# Patient Record
Sex: Female | Born: 1965
Health system: Southern US, Community
[De-identification: ages and names within clinical notes are randomized; demographics above are authoritative.]

## PROBLEM LIST (undated history)

## (undated) DIAGNOSIS — R112 Nausea with vomiting, unspecified: Secondary | ICD-10-CM

## (undated) DIAGNOSIS — B977 Papillomavirus as the cause of diseases classified elsewhere: Secondary | ICD-10-CM

## (undated) DIAGNOSIS — D649 Anemia, unspecified: Secondary | ICD-10-CM

## (undated) DIAGNOSIS — E039 Hypothyroidism, unspecified: Secondary | ICD-10-CM

## (undated) DIAGNOSIS — I10 Essential (primary) hypertension: Secondary | ICD-10-CM

## (undated) DIAGNOSIS — Z9889 Other specified postprocedural states: Secondary | ICD-10-CM

## (undated) HISTORY — DX: Essential (primary) hypertension: I10

## (undated) HISTORY — DX: Hypothyroidism, unspecified: E03.9

## (undated) HISTORY — PX: ABDOMINAL HYSTERECTOMY: SHX81

## (undated) HISTORY — PX: WISDOM TOOTH EXTRACTION: SHX21

## (undated) HISTORY — PX: COLONOSCOPY: SHX174

---

## 1988-03-09 HISTORY — PX: ECTOPIC PREGNANCY SURGERY: SHX613

## 2002-09-29 ENCOUNTER — Other Ambulatory Visit: Admission: RE | Admit: 2002-09-29 | Discharge: 2002-09-29 | Payer: Self-pay | Admitting: Gynecology

## 2003-08-08 ENCOUNTER — Other Ambulatory Visit: Admission: RE | Admit: 2003-08-08 | Discharge: 2003-08-08 | Payer: Self-pay | Admitting: Gynecology

## 2003-10-17 ENCOUNTER — Other Ambulatory Visit: Admission: RE | Admit: 2003-10-17 | Discharge: 2003-10-17 | Payer: Self-pay | Admitting: Gynecology

## 2004-03-09 HISTORY — PX: TUBAL LIGATION: SHX77

## 2004-05-29 ENCOUNTER — Other Ambulatory Visit: Admission: RE | Admit: 2004-05-29 | Discharge: 2004-05-29 | Payer: Self-pay | Admitting: Gynecology

## 2004-07-21 ENCOUNTER — Other Ambulatory Visit: Admission: RE | Admit: 2004-07-21 | Discharge: 2004-07-21 | Payer: Self-pay | Admitting: Gynecology

## 2004-08-18 ENCOUNTER — Inpatient Hospital Stay (HOSPITAL_COMMUNITY): Admission: AD | Admit: 2004-08-18 | Discharge: 2004-08-18 | Payer: Self-pay | Admitting: Gynecology

## 2004-12-09 ENCOUNTER — Encounter: Admission: RE | Admit: 2004-12-09 | Discharge: 2004-12-10 | Payer: Self-pay | Admitting: Gynecology

## 2005-01-26 ENCOUNTER — Encounter (INDEPENDENT_AMBULATORY_CARE_PROVIDER_SITE_OTHER): Payer: Self-pay | Admitting: Specialist

## 2005-01-26 ENCOUNTER — Inpatient Hospital Stay (HOSPITAL_COMMUNITY): Admission: RE | Admit: 2005-01-26 | Discharge: 2005-01-29 | Payer: Self-pay | Admitting: Gynecology

## 2005-02-02 ENCOUNTER — Ambulatory Visit: Payer: Self-pay | Admitting: Pulmonary Disease

## 2005-02-02 ENCOUNTER — Inpatient Hospital Stay (HOSPITAL_COMMUNITY): Admission: AD | Admit: 2005-02-02 | Discharge: 2005-02-04 | Payer: Self-pay | Admitting: Gynecology

## 2005-02-06 ENCOUNTER — Emergency Department (HOSPITAL_COMMUNITY): Admission: EM | Admit: 2005-02-06 | Discharge: 2005-02-06 | Payer: Self-pay | Admitting: Emergency Medicine

## 2005-03-18 ENCOUNTER — Other Ambulatory Visit: Admission: RE | Admit: 2005-03-18 | Discharge: 2005-03-18 | Payer: Self-pay | Admitting: Gynecology

## 2006-05-24 ENCOUNTER — Other Ambulatory Visit: Admission: RE | Admit: 2006-05-24 | Discharge: 2006-05-24 | Payer: Self-pay | Admitting: Gynecology

## 2006-09-24 IMAGING — CR DG CERVICAL SPINE COMPLETE 4+V
5 series · 5 of 5 positions shown · non-contrast
Comparison: none

CLINICAL DATA: Posterior neck pain without trauma

Cervical spine 5 view:
Straightening of the normal lordosis of the cervical spine. Negative for
fracture, dislocation, or other acute bone injury. No significant degenerative
change. No prevertebral soft tissue swelling. Multiple dental restorations
noted.

[w c-spine lat]
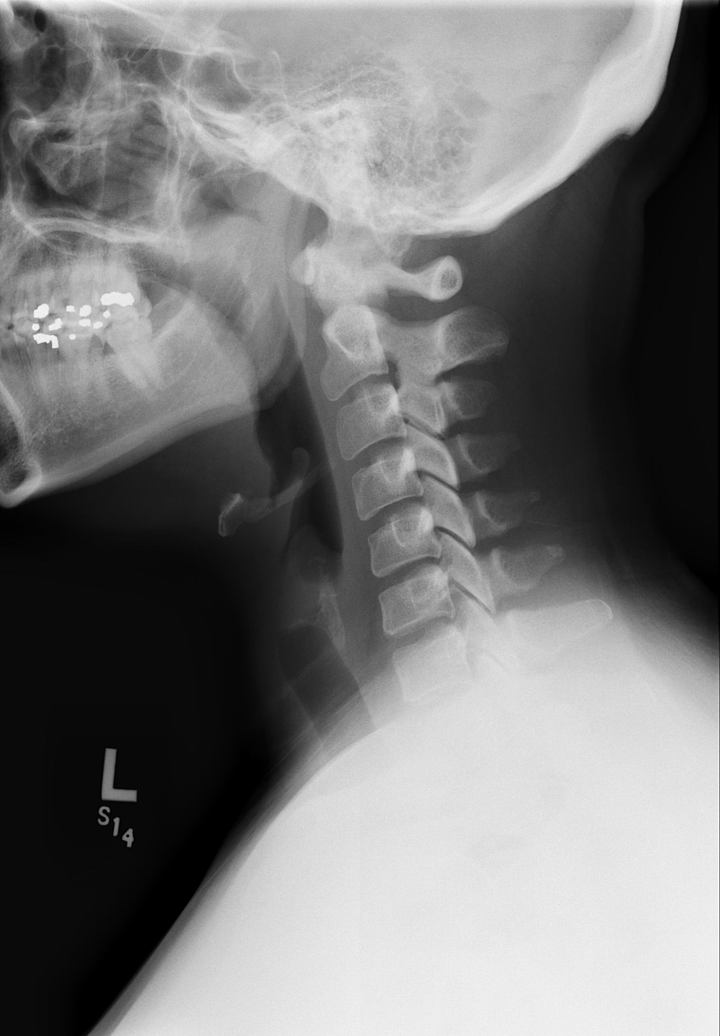

[w c-spine oblique (1 of 2)]
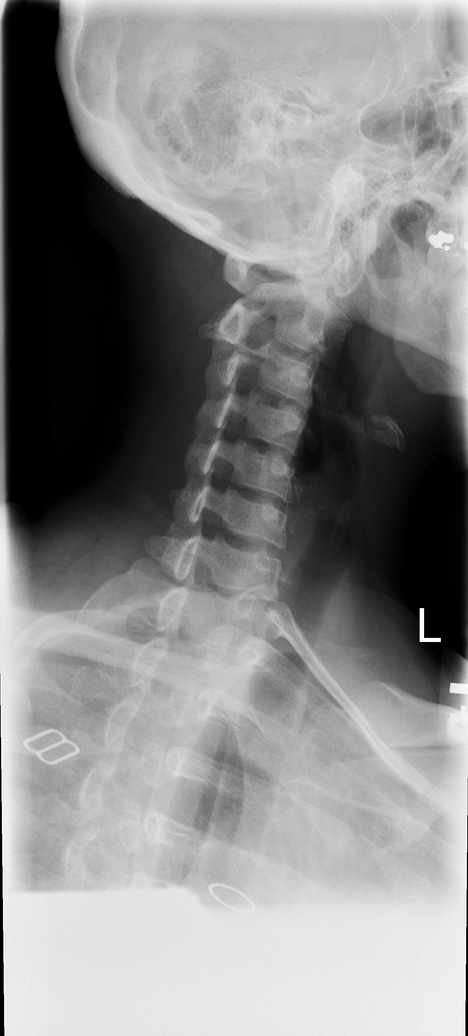

[w c-spine oblique (2 of 2)]
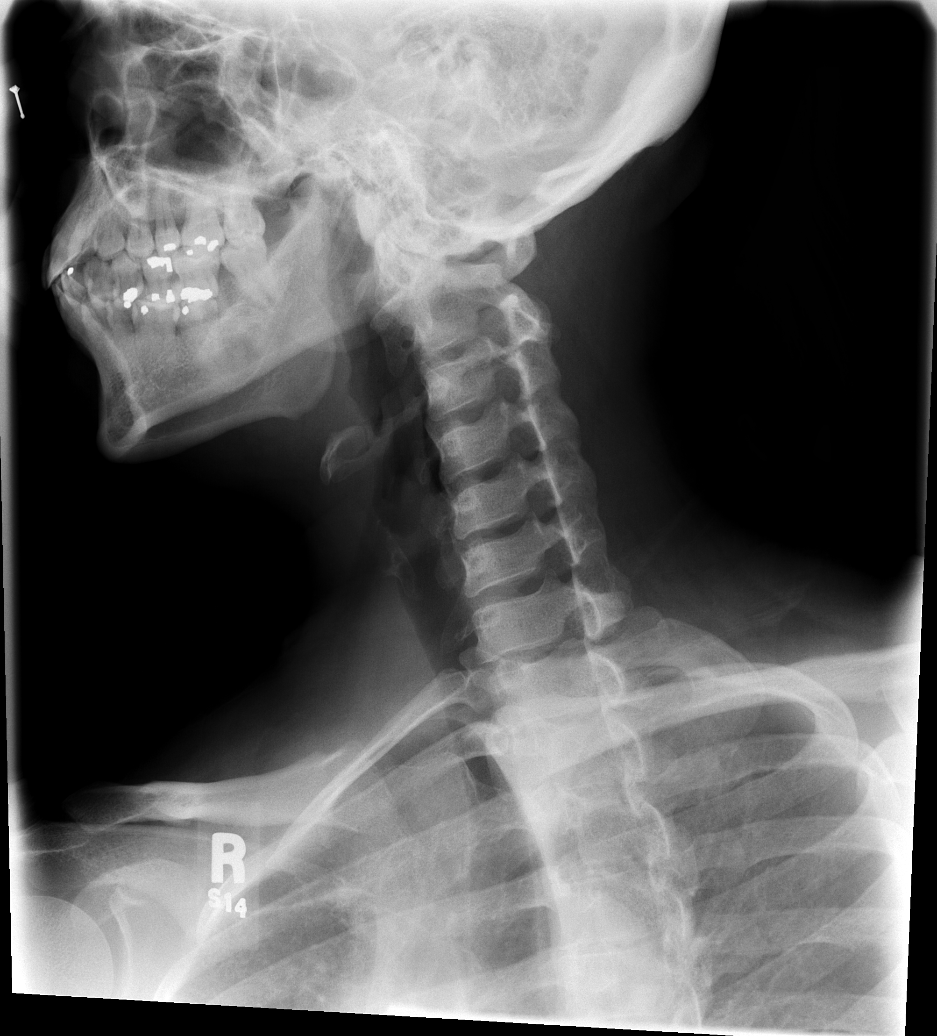

[w c-spine a.p. *]
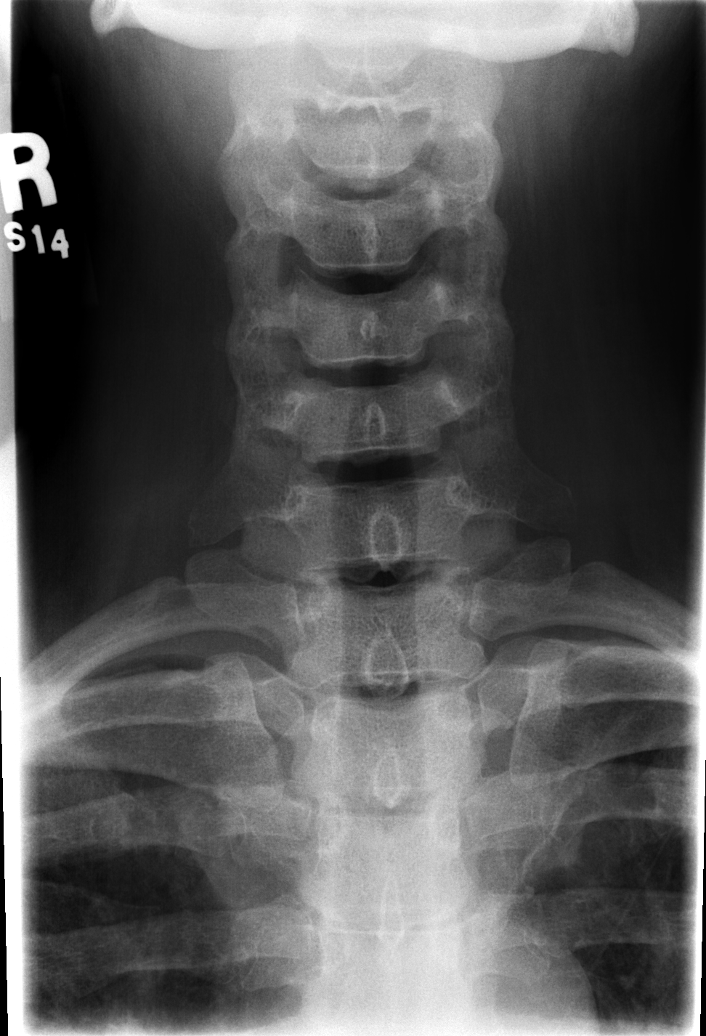

[w c-spine odontoid *]
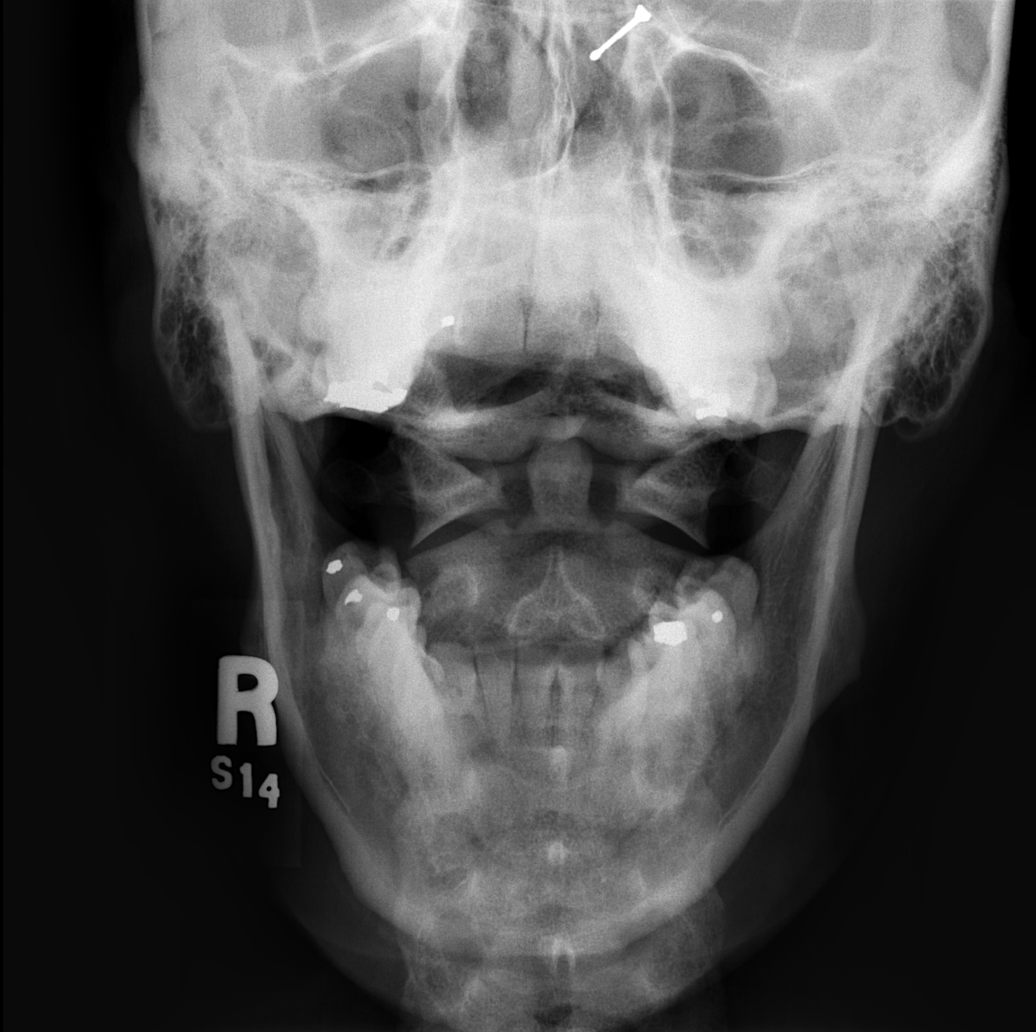

[5 of 5 positions shown; findings below may reference images not displayed]

IMPRESSION: 1. Negative for fracture or other acute bone abnormality.
2. Straightening of the normal lordosis, which may be due to positioning, spasm,
or soft tissue injury.

## 2006-12-09 ENCOUNTER — Encounter: Admission: RE | Admit: 2006-12-09 | Discharge: 2006-12-09 | Payer: Self-pay | Admitting: Family Medicine

## 2007-09-28 ENCOUNTER — Other Ambulatory Visit: Admission: RE | Admit: 2007-09-28 | Discharge: 2007-09-28 | Payer: Self-pay | Admitting: Gynecology

## 2008-04-04 ENCOUNTER — Encounter: Payer: Self-pay | Admitting: Women's Health

## 2008-04-04 ENCOUNTER — Ambulatory Visit: Payer: Self-pay | Admitting: Women's Health

## 2008-04-04 ENCOUNTER — Other Ambulatory Visit: Admission: RE | Admit: 2008-04-04 | Discharge: 2008-04-04 | Payer: Self-pay | Admitting: Gynecology

## 2008-10-01 ENCOUNTER — Encounter: Payer: Self-pay | Admitting: Women's Health

## 2008-10-01 ENCOUNTER — Ambulatory Visit: Payer: Self-pay | Admitting: Women's Health

## 2008-10-01 ENCOUNTER — Other Ambulatory Visit: Admission: RE | Admit: 2008-10-01 | Discharge: 2008-10-01 | Payer: Self-pay | Admitting: Gynecology

## 2008-10-23 ENCOUNTER — Ambulatory Visit: Payer: Self-pay | Admitting: Gynecology

## 2008-11-06 ENCOUNTER — Encounter: Admission: RE | Admit: 2008-11-06 | Discharge: 2008-11-06 | Payer: Self-pay | Admitting: Gynecology

## 2008-11-07 HISTORY — PX: HYSTEROSCOPY: SHX211

## 2008-11-13 ENCOUNTER — Ambulatory Visit: Payer: Self-pay | Admitting: Gynecology

## 2008-11-15 ENCOUNTER — Ambulatory Visit: Payer: Self-pay | Admitting: Gynecology

## 2008-11-15 ENCOUNTER — Encounter: Payer: Self-pay | Admitting: Gynecology

## 2008-11-15 ENCOUNTER — Ambulatory Visit (HOSPITAL_BASED_OUTPATIENT_CLINIC_OR_DEPARTMENT_OTHER): Admission: RE | Admit: 2008-11-15 | Discharge: 2008-11-15 | Payer: Self-pay | Admitting: Gynecology

## 2008-11-29 ENCOUNTER — Ambulatory Visit: Payer: Self-pay | Admitting: Gynecology

## 2009-10-22 ENCOUNTER — Encounter: Admission: RE | Admit: 2009-10-22 | Discharge: 2009-10-22 | Payer: Self-pay | Admitting: Family Medicine

## 2009-11-13 ENCOUNTER — Encounter: Admission: RE | Admit: 2009-11-13 | Discharge: 2009-11-13 | Payer: Self-pay | Admitting: Gynecology

## 2009-11-20 ENCOUNTER — Ambulatory Visit: Payer: Self-pay | Admitting: Gynecology

## 2009-11-20 ENCOUNTER — Other Ambulatory Visit: Admission: RE | Admit: 2009-11-20 | Discharge: 2009-11-20 | Payer: Self-pay | Admitting: Gynecology

## 2010-03-29 ENCOUNTER — Encounter: Payer: Self-pay | Admitting: Gynecology

## 2010-07-25 NOTE — Op Note (Signed)
[name], [name]          ACCOUNT NO.:  [id number]   MEDICAL RECORD NO.:  [id number]          PATIENT TYPE:  INP   LOCATION:  9102                          FACILITY:  [facility]   PHYSICIAN:  Ivor Costa. Farrel Gobble, M.D. DATE OF BIRTH:  December 14, 1965   DATE OF PROCEDURE:  01/26/2005  DATE OF DISCHARGE:                                 OPERATIVE REPORT   PREOPERATIVE DIAGNOSES:  1.  Previous cesarean section for elective repeat.  2.  A2 diabetes.  3.  Chronic hypertension.  4.  Undesired fertility.   POSTOPERATIVE DIAGNOSES:  1.  Previous C-section for elective repeat.  2.  A2 diabetes.  3.  Chronic hypertension.  4.  Undesired fertility.   PROCEDURE:  Repeat cesarean section, low flap transverse, and a right tubal  ligation.   SURGEON:  Ivor Costa. Farrel Gobble, M.D.   ASSISTANT:  Gaetano Hawthorne. Lily Peer, M.D.   ANESTHESIA:  Spinal.   IV FLUIDS:  2300 cc of lactated Ringer's.   ESTIMATED BLOOD LOSS:  700 cc.   URINE OUTPUT:  200 cc of clear urine.   FINDINGS:  Viable female in OA presentation.  Birth weight 6 pounds 11  ounces.  Apgars 8 and 9.  Multifibroid uterus.  Left tube surgically absent.  Right adnexa and left ovary were normal.   PATHOLOGY:  None.   COMPLICATIONS:  None.   DESCRIPTION OF PROCEDURE:  The patient was taken to the operating room.  Spinal anesthesia was induced, and the patient was placed in the supine  position left lateral displacement and prepped and draped in the usual  sterile fashion.   After adequate anesthesia was obtained, a skin incision was made with the  scalpel going through the previous C-section scar and carried through to the  underlying layer of fascia with the scalpel.  The fascia was then nicked in  the midline, and the incision was extended laterally with the Bovie.  The  inferior aspect of the fascial incision was grasped with Kocher's.  The  underlying rectus muscles were dissected off by blunt and sharp dissection  in similar fashion.   The superior aspect of the incision was grasped with  Kocher's, and the underlying rectus muscles were dissected off.  The rectus  muscles were elevated and then entered bluntly into the peritoneal cavity.  The incision was then extended further superiorly with an examining finger  in the pelvis.  The peritoneal incision was then extended superiorly and  inferiorly.  The bladder blade was inserted.  The vesicouterine peritoneum  was identified and entered sharply with Metzenbaum scissors.  The incision  was extended laterally.  The bladder flap was created digitally.  The  bladder blade was then inserted and the lower uterine segment incised in a  transverse fashion with the scalpel.  Clear amniotic fluid was noted.  The  uterine scar was noted to be markedly thin.  The infant was delivered with  the aid of baby [name] [name].  The cord was clamped and cut and handed off to the  awaiting pediatricians.  Cord bloods were obtained.  The uterus was then  massaged the placenta was  allowed to separate naturally.  The uterus was  irrigated of all clots and debris.  The uterine incision was repaired with  running locked layer of 0 chromic.  Because of the thinness of the inferior  portion, it was not imbricated.  A suture was placed at the angle for  hemostasis.  The uterus was then rotated.  The left tube was noted to be  surgically absent.  The ovary was otherwise unremarkable.  The right tube  was visualized and brought into the incision, elevated, and two free ties of  3-0 plain were placed.  The midportion was excised.  It was noted to be  hemostatic and returned back to the pelvis.  The pelvis was then irrigated  with copious amounts of warm saline.  The incision was inspected and noted  to be unremarkable.  The gutters were cleared of all clots and debris.  Inspection of the muscle and peritoneum assured [place] of hemostasis.  The  fascia then closed with 0 Vicryl in a running fashion.  The  subcutaneous  tissue was irrigated, and the skin was closed with staples.  The patient  tolerated the procedure well.  Sponge, lap, and needle counts were correct  x2.   She was transferred to the PACU in stable condition.      Ivor Costa. Farrel Gobble, M.D.  Electronically Signed     THL/MEDQ  D:  01/26/2005  T:  01/26/2005  Job:  [id number]

## 2010-07-25 NOTE — Discharge Summary (Signed)
[name] [name], [name] [name]          ACCOUNT NO.:  [id number]   MEDICAL RECORD NO.:  [id number]          PATIENT TYPE:  INP   LOCATION:  9102                          [facility]ACILITY:  [facility]   PHYSICIAN:  Timothy P. Fontaine, M.D.DATE OF BIRTH:  1965-12-11   DATE OF ADMISSION:  01/26/2005  DATE OF DISCHARGE:                                 DISCHARGE SUMMARY   DISCHARGE DIAGNOSES:  1.  Pregnancy at term.  2.  Prior cesarean section for repeat cesarean section.  3.  Desires permanent sterilization.  4.  A2 diabetes.  5.  Chronic hypertension.   PROCEDURE:  Repeat low transverse cervical cesarean section, right tubal  sterilization 01/2005 Dr. Douglass Rivers.   HOSPITAL COURSE:  A 45 year old gravida 3, para 1 female at term gestation,  history of type A2 diabetes requiring 16 units of NPH at bedtime, history of  chronic hypertension, history of prior cesarean section for repeat cesarean  section, tubal sterilization. The patient has previously undergone a left  salpingectomy due to ectopic pregnancy. The patient underwent an  uncomplicated repeat cesarean section, right tubal sterilization, 01/26/2005  by Dr. Douglass Rivers. Her postoperative course was uncomplicated. She was  discharged on postoperative day #3 ambulating well, tolerating a regular  diet with a postoperative hemoglobin of 9.1. The patient received  precautions, instructions and follow-up. Will be seen in the office in 6  weeks following discharge. She received prescriptions for Motrin 800  milligrams #25 one p.o. q.8 h p.r.n. pain and Tylox #25 one to two p.o. q.4-  6 hours p.r.n. pain. She is blood type A+, rubella titer is positive.      Timothy P. Fontaine, M.D.  Electronically Signed     TPF/MEDQ  D:  01/29/2005  T:  01/29/2005  Job:  [id number]

## 2010-07-25 NOTE — Discharge Summary (Signed)
NAMESAMANTA, [name] [name]          ACCOUNT NO.:  [id number]   MEDICAL RECORD NO.:  [id number]          PATIENT TYPE:  INP   LOCATION:  9306                          [facility]   PHYSICIAN:  Timothy P. Fontaine, M.D.DATE OF BIRTH:  [birth year]   DATE OF ADMISSION:  02/02/2005  DATE OF DISCHARGE:                                 DISCHARGE SUMMARY   DISCHARGE DIAGNOSES:  1.  Immediately postpartum status post cesarean section.  2.  Rule out hemolysis, elevated liver function, and low platelet syndrome.  3.  Rule out cardiac event, ruled out.  4.  Cholelithiasis suspect.   PROCEDURE:  None.   HOSPITAL COURSE:  A 45 year old G3 P2 AB 1 female status post repeat  cesarean section January 26, 2005. The patient was followed for chronic  hypertension, was on Aldomet 250 mg b.i.d.. The patient was admitted after a  several-day history of increased blood pressures at home and chest  discomfort and was found pm admission to have an elevated blood pressure in  the 188/108, 168/106 range. She had elevated SGOT, SGPT, and a decreased  platelet count of 123,000. She was admitted with a diagnosis of HELLP  postpartum, chronic hypertension, chest pain, rule out cardiac event. The  patient subsequently underwent a pulmonary CCU consult, had serial cardiac  isoenzymes, EKG performed, and was felt not to have had a cardiac event as  these were normal. She received magnesium sulfate over 24 hours. Her blood  pressures responded. She was changed to labetalol 100 mg b.i.d. and her  SGOT/SGPT were noted to be decreasing with her last values SGOT is 23 and  SGPT is marginally elevated to 54. Her platelet count was 224,000 at the  time of discharge. During the hospitalization the patient had noted that  during her prenatal course they had seen gallstones on an obstetrical  ultrasound and we reviewed as to whether this was not also responsible for  some of her upper abdominal chest discomfort. The patient  has an ultrasound  of the gallbladder ordered the day of discharge, the results of which are  pending, and tentatively will be scheduled to see a general surgeon assuming  this shows gallstones. The patient was discharged with her blood pressures  in the 140/80 range on labetalol 100 mg b.i.d., will continue on this. She  is able to monitor blood pressures at home and was given strict criteria for  ASAP call precautions, not only for her blood pressure but as far as  symptoms such as headaches, visual changes, increasing swelling, shortness  of breath, or any other symptoms. The patient will follow up with [place] in 4  weeks following discharge for a postpartum check but knows to ASAP if any  symptomatology.      Timothy P. Fontaine, M.D.  Electronically Signed     TPF/MEDQ  D:  02/04/2005  T:  02/04/2005  Job:  [id number]

## 2010-07-25 NOTE — Consult Note (Signed)
[name] [name], [name] [name]          ACCOUNT NO.:  [id number]   MEDICAL RECORD NO.:  [id number]          PATIENT TYPE:  INP   LOCATION:  9306                          [facility]   PHYSICIAN:  Timothy P. Fontaine, M.D.DATE OF BIRTH:  1965-11-16   DATE OF CONSULTATION:  02/04/2005  DATE OF DISCHARGE:                                   CONSULTATION   The patient evaluated on rounds, reports feeling well. She did have an  episode of some upper abdominal gas pain, cramping and some chest discomfort  earlier this morning. This resolved totally and she feels fine now. During  the episode her vitals showed a pulse of 68, blood pressure 163/77, oxygen  saturation at 98%. The patient did note historically during one of her  obstetrical ultrasound they noted gallstones. She has a strong family  history of gallstones and wonders whether this is all related to this.   EXAMINATION AT PRESENT:  HEENT:  Normal.  LUNGS:  Clear.  CARDIAC:  Regular rate. No rubs, murmurs or gallops.  ABDOMEN:  Benign. Her incision is healing nicely.  EXTREMITIES:  DTRs are 2-3+, symmetrical. No clonus, no edema.   I reviewed the situation with the patient. Her blood pressures were  remaining in the 140 range over 80 range, pulses in the 60 range, saturation  is good, and overall she is feeling well. Given the history of gallstones, I  wonder if a lot of this upper abdominal chest discomfort is not related to  her gallstones. Certainly with her elevated blood pressure at admission and  her mild bump and LFTs, consider postpartum HELLP, but also could be  possibly due to gallbladder disease. Regardless, she is feeling well now.  Her cardiac enzymes, other evaluation was all reported negative. We will  check an ultrasound now while she is in the hospital of her gallbladder, and  then tentatively plan on discharging home to continue on her Protonix and  her labetalol 100 mg b.i.d. She is able to take her blood pressure  at home.  She will continue monitor this. As long as it is in an acceptable range,  which we discussed the parameters, she will monitor. She is having no  headaches, visual changes or other symptoms. If she develops any symptoms  whatsoever, she knows to call us ASAP for reevaluation. Discussed follow-up  in a week or so in the office, although she would prefer just to wait as  long as her blood pressure is normal at home and she is feeling well, and  she will see [place] for her postpartum checkup in 4-6 weeks. We will tentatively  plan on general surgical evaluation over the next several weeks with an  appointment, assuming that her ultrasound does confirm gallstones today. The  patient's questions were answered. She is comfortable with the plan and  discharge.      Timothy P. Fontaine, M.D.  Electronically Signed     TPF/MEDQ  D:  02/04/2005  T:  02/04/2005  Job:  [id number]

## 2010-07-25 NOTE — H&P (Signed)
[name] [name], [name] [name]          ACCOUNT NO.:  [id number]   MEDICAL RECORD NO.:  [id number]          PATIENT TYPE:  INP   LOCATION:  NA                            [facility]   PHYSICIAN:  Ivor Costa. Farrel Gobble, M.D. DATE OF BIRTH:  1965-12-24   DATE OF ADMISSION:  01/26/2005  DATE OF DISCHARGE:                                HISTORY & PHYSICAL   CHIEF COMPLAINT:  Term pregnancy for elective repeat.   HISTORY OF PRESENT ILLNESS:  The patient is a 45 year old G3, P1 with an  estimated date of confinement of February 04, 2005 based on an early first  trimester ultrasound who presents for an elective repeat cesarean section.  The patient had a primary cesarean section done in 1995 without any  complaints.  Her pregnancy has been complicated by A2 diabetes for which the  patient is in 16 units of NPH at bedtime.  She reports her fasting finger  sticks still remain above 90.  Her postprandials all have been normal.  The  patient's pregnancy also is complicated by advanced maternal age for which  she underwent an amniocentesis which came back as a normal female and lastly  by multiple uterine fibroids, the largest of which was 8.5 x 6.5 x 6.5 at  which point she ended up having degeneration of the fibroid throughout the  middle portion of the second trimester which has since resolved.  The  patient is currently without complaints.  She is being followed with NSTs  and AFIs because of her gestational diabetes.  She had an estimated fetal  weight done last week which shows a weight of 6.5 pounds with a fluid of  13.3.  The patient reports good vaginal bleeding, no contractions, and is  without complaints.  Please refer to the [facility].  She is A+, antibody  negative.  RPR nonreactive.  Rubella immune.  Hepatitis B surface antigen  nonreactive.  HIV nonreactive.  GBS negative.   PHYSICAL EXAMINATION:  GENERAL:  She is a well-appearing gravida in no acute  distress.  VITAL SIGNS:  Her weight  is 208 which is a 27 pound weight gain.  HEART:  Regular rate.  LUNGS:  Clear to auscultation.  ABDOMEN:  Gravid, soft, nontender with fundal height of 38.  Heart tones  were auscultated.  EXTREMITIES:  Negative.  PELVIC:  Vaginal examination was deferred.   ASSESSMENT:  Term pregnancy with A2 diabetes and previous cesarean section  for an elective repeat.  The patient also requests a tubal ligation.  She  has had a salpingectomy on the left as a result of an ectopic in 1990.  Risks and benefits of the procedure were discussed.  All questions were  addressed.  She was given a prescription for Ultram for postoperative pain.      Ivor Costa. Farrel Gobble, M.D.  Electronically Signed     THL/MEDQ  D:  01/23/2005  T:  01/23/2005  Job:  [id number]

## 2010-11-21 DIAGNOSIS — E039 Hypothyroidism, unspecified: Secondary | ICD-10-CM | POA: Insufficient documentation

## 2010-11-21 DIAGNOSIS — I10 Essential (primary) hypertension: Secondary | ICD-10-CM | POA: Insufficient documentation

## 2010-11-24 ENCOUNTER — Ambulatory Visit (INDEPENDENT_AMBULATORY_CARE_PROVIDER_SITE_OTHER): Payer: BC Managed Care – PPO | Admitting: Women's Health

## 2010-11-24 ENCOUNTER — Other Ambulatory Visit (HOSPITAL_COMMUNITY)
Admission: RE | Admit: 2010-11-24 | Discharge: 2010-11-24 | Disposition: A | Discharge status: Home / Self Care | Payer: BC Managed Care – PPO | Source: Ambulatory Visit | Attending: Gynecology | Admitting: Gynecology

## 2010-11-24 ENCOUNTER — Encounter: Payer: Self-pay | Admitting: Women's Health

## 2010-11-24 VITALS — BP 126/80 | Ht 64.0 in | Wt 194.0 lb

## 2010-11-24 DIAGNOSIS — Z01419 Encounter for gynecological examination (general) (routine) without abnormal findings: Secondary | ICD-10-CM

## 2010-11-24 NOTE — Progress Notes (Signed)
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  44 yo daughter Fonda Kinder in [place].   Past medical history, past surgical history, family history and social history were all reviewed and documented in the EPIC chart.   ROS:  A  ROS was performed and pertinent positives and negatives are included in the history.  Exam:  Filed Vitals:   11/24/10 0843  BP: 126/80    General appearance:  Normal Head/Neck:  Normal, without cervical or supraclavicular adenopathy. Thyroid:  Symmetrical, normal in size, without palpable masses or nodularity. Respiratory  Effort:  Normal  Auscultation:  Clear without wheezing or rhonchi Cardiovascular  Auscultation:  Regular rate, without rubs, murmurs or gallops  Edema/varicosities:  Not grossly evident Abdominal  Soft,nontender, without masses, guarding or rebound.  Liver/spleen:  No organomegaly noted  Hernia:  None appreciated  Skin  Inspection:  Grossly normal  Palpation:  Grossly normal Neurologic/psychiatric  Orientation:  Normal with appropriate conversation.  Mood/affect:  Normal  Genitourinary    Breasts: Examined lying and sitting.     Right: Without masses, retractions, discharge or axillary adenopathy.     Left: Without masses, retractions, discharge or axillary adenopathy.   Inguinal/mons:  Normal without inguinal adenopathy  External genitalia:  Normal  BUS/Urethra/Skene's glands:  Normal  Bladder:  Normal  Vagina:  Normal  Cervix:  Normal  Uterus:  Fibroids.  Midline and mobile  Adnexa/parametria:     Rt: Without masses or tenderness.   Lt: Without masses or tenderness.  Anus and perineum: Normal  Digital rectal exam: Normal sphincter tone without palpated masses or tenderness  Assessment/Plan:  45 y.o.MBF G3P2  for annual exam.  Monthly 6-7day cycle/BTL. History of benign polyp removed  hysteroscopically in Sept, 2010. History of HTN and hypothyroidism, PC- labs and meds. Has had joint problems with elevated ANA and rhuematoid factor-has FU scheduled.    Normal GYN exam with fibroids  Plan: Pap only. SBEs encouraged, annual mammogram, due this month, will schedule.  Encouraged exercise, calcium rich diet.     Harrington Challenger [facility], 1:22 PM 11/24/2010

## 2010-12-16 ENCOUNTER — Other Ambulatory Visit: Payer: Self-pay | Admitting: Gynecology

## 2010-12-16 DIAGNOSIS — Z1231 Encounter for screening mammogram for malignant neoplasm of breast: Secondary | ICD-10-CM

## 2010-12-31 ENCOUNTER — Ambulatory Visit
Admission: RE | Admit: 2010-12-31 | Discharge: 2010-12-31 | Disposition: A | Discharge status: Home / Self Care | Payer: BC Managed Care – PPO | Source: Ambulatory Visit | Attending: Gynecology | Admitting: Gynecology

## 2010-12-31 DIAGNOSIS — Z1231 Encounter for screening mammogram for malignant neoplasm of breast: Secondary | ICD-10-CM

## 2011-12-11 ENCOUNTER — Encounter: Payer: BC Managed Care – PPO | Admitting: Women's Health

## 2011-12-17 ENCOUNTER — Ambulatory Visit (INDEPENDENT_AMBULATORY_CARE_PROVIDER_SITE_OTHER): Payer: BC Managed Care – PPO | Admitting: Women's Health

## 2011-12-17 ENCOUNTER — Encounter: Payer: Self-pay | Admitting: Women's Health

## 2011-12-17 VITALS — BP 128/94 | Ht 65.0 in | Wt 200.0 lb

## 2011-12-17 DIAGNOSIS — Z01419 Encounter for gynecological examination (general) (routine) without abnormal findings: Secondary | ICD-10-CM

## 2011-12-17 NOTE — Progress Notes (Signed)
[name] [name] 10-31-1965 [id number]    History:    The patient presents for annual exam.  Monthly light 2-3 d cycle/BTL. History of normal Paps and mammograms. Hypertensive and hypothyroid/ primary care manages. Hysteroscope 2010 benign polyp.   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 History of GDM. Parents and sister hypertension. Mother diabetes.   ROS:  A  ROS was performed and pertinent positives and negatives are included in the history.  Exam:  Filed Vitals:   12/17/11 0834  BP: 128/94    General appearance:  Normal Head/Neck:  Normal, without cervical or supraclavicular adenopathy. Thyroid:  Symmetrical, normal in size, without palpable masses or nodularity. Respiratory  Effort:  Normal  Auscultation:  Clear without wheezing or rhonchi Cardiovascular  Auscultation:  Regular rate, without rubs, murmurs or gallops  Edema/varicosities:  Not grossly evident Abdominal  Soft,nontender, without masses, guarding or rebound.  Liver/spleen:  No organomegaly noted  Hernia:  None appreciated  Skin  Inspection:  Grossly normal  Palpation:  Grossly normal Neurologic/psychiatric  Orientation:  Normal with appropriate conversation.  Mood/affect:  Normal  Genitourinary    Breasts: Examined lying and sitting.     Right: Without masses, retractions, discharge or axillary adenopathy.     Left: Without masses, retractions, discharge or axillary adenopathy.   Inguinal/mons:  Normal without inguinal adenopathy  External genitalia:  Normal  BUS/Urethra/Skene's glands:  Normal  Bladder:  Normal  Vagina:  Normal  Cervix:  Normal  Uterus:   normal in size, shape and contour.  Midline and mobile  Adnexa/parametria:     Rt: Without masses or  tenderness.   Lt: Without masses or tenderness.  Anus and perineum: Normal  Digital rectal exam: Normal sphincter tone without palpated masses or tenderness  Assessment/Plan:  46 y.o. MBF G3 P2 for annual exam.    Normal GYN exam/BTL Hypertension/hypothyroid-primary care labs and meds Obesity  Plan: SBE's, continue annual mammogram, calcium rich diet, vitamin D 1000 daily, increase exercise and decrease calories for weight loss encouraged. No Pap, normal Pap 2012, new screening guidelines reviewed.   [name] [name] [facility], 10:31 AM 12/17/2011

## 2011-12-17 NOTE — Patient Instructions (Addendum)
Vit D 1000 daily Health Maintenance, Females A healthy lifestyle and preventative care can promote health and wellness.  Maintain regular health, dental, and eye exams.  Eat a healthy diet. Foods like vegetables, fruits, whole grains, low-fat dairy products, and lean protein foods contain the nutrients you need without too many calories. Decrease your intake of foods high in solid fats, added sugars, and salt. Get information about a proper diet from your caregiver, if necessary.  Regular physical exercise is one of the most important things you can do for your health. Most adults should get at least 150 minutes of moderate-intensity exercise (any activity that increases your heart rate and causes you to sweat) each week. In addition, most adults need muscle-strengthening exercises on 2 or more days a week.   Maintain a healthy weight. The body mass index (BMI) is a screening tool to identify possible weight problems. It provides an estimate of body fat based on height and weight. Your caregiver can help determine your BMI, and can help you achieve or maintain a healthy weight. For adults 20 years and older:  A BMI below 18.5 is considered underweight.  A BMI of 18.5 to 24.9 is normal.  A BMI of 25 to 29.9 is considered overweight.  A BMI of 30 and above is considered obese.  Maintain normal blood lipids and cholesterol by exercising and minimizing your intake of saturated fat. Eat a balanced diet with plenty of fruits and vegetables. Blood tests for lipids and cholesterol should begin at age 20 and be repeated every 5 years. If your lipid or cholesterol levels are high, you are over 50, or you are a high risk for heart disease, you may need your cholesterol levels checked more frequently.Ongoing high lipid and cholesterol levels should be treated with medicines if diet and exercise are not effective.  If you smoke, find out from your caregiver how to quit. If you do not use tobacco, do not  start.  If you are pregnant, do not drink alcohol. If you are breastfeeding, be very cautious about drinking alcohol. If you are not pregnant and choose to drink alcohol, do not exceed 1 drink per day. One drink is considered to be 12 ounces (355 mL) of beer, 5 ounces (148 mL) of wine, or 1.5 ounces (44 mL) of liquor.  Avoid use of street drugs. Do not share needles with anyone. Ask for help if you need support or instructions about stopping the use of drugs.  High blood pressure causes heart disease and increases the risk of stroke. Blood pressure should be checked at least every 1 to 2 years. Ongoing high blood pressure should be treated with medicines, if weight loss and exercise are not effective.  If you are 55 to 45 years old, ask your caregiver if you should take aspirin to prevent strokes.  Diabetes screening involves taking a blood sample to check your fasting blood sugar level. This should be done once every 3 years, after age 45, if you are within normal weight and without risk factors for diabetes. Testing should be considered at a younger age or be carried out more frequently if you are overweight and have at least 1 risk factor for diabetes.  Breast cancer screening is essential preventative care for women. You should practice "breast self-awareness." This means understanding the normal appearance and feel of your breasts and may include breast self-examination. Any changes detected, no matter how small, should be reported to a caregiver. Women in their 20s   and 30s should have a clinical breast exam (CBE) by a caregiver as part of a regular health exam every 1 to 3 years. After age 40, women should have a CBE every year. Starting at age 40, women should consider having a mammogram (breast X-ray) every year. Women who have a family history of breast cancer should talk to their caregiver about genetic screening. Women at a high risk of breast cancer should talk to their caregiver about having  an MRI and a mammogram every year.  The Pap test is a screening test for cervical cancer. Women should have a Pap test starting at age 21. Between ages 21 and 29, Pap tests should be repeated every 2 years. Beginning at age 30, you should have a Pap test every 3 years as long as the past 3 Pap tests have been normal. If you had a hysterectomy for a problem that was not cancer or a condition that could lead to cancer, then you no longer need Pap tests. If you are between ages 65 and 70, and you have had normal Pap tests going back 10 years, you no longer need Pap tests. If you have had past treatment for cervical cancer or a condition that could lead to cancer, you need Pap tests and screening for cancer for at least 20 years after your treatment. If Pap tests have been discontinued, risk factors (such as a new sexual partner) need to be reassessed to determine if screening should be resumed. Some women have medical problems that increase the chance of getting cervical cancer. In these cases, your caregiver may recommend more frequent screening and Pap tests.  The human papillomavirus (HPV) test is an additional test that may be used for cervical cancer screening. The HPV test looks for the virus that can cause the cell changes on the cervix. The cells collected during the Pap test can be tested for HPV. The HPV test could be used to screen women aged 30 years and older, and should be used in women of any age who have unclear Pap test results. After the age of 30, women should have HPV testing at the same frequency as a Pap test.  Colorectal cancer can be detected and often prevented. Most routine colorectal cancer screening begins at the age of 50 and continues through age 75. However, your caregiver may recommend screening at an earlier age if you have risk factors for colon cancer. On a yearly basis, your caregiver may provide home test kits to check for hidden blood in the stool. Use of a small camera at  the end of a tube, to directly examine the colon (sigmoidoscopy or colonoscopy), can detect the earliest forms of colorectal cancer. Talk to your caregiver about this at age 50, when routine screening begins. Direct examination of the colon should be repeated every 5 to 10 years through age 75, unless early forms of pre-cancerous polyps or small growths are found.  Hepatitis C blood testing is recommended for all people born from 1945 through 1965 and any individual with known risks for hepatitis C.  Practice safe sex. Use condoms and avoid high-risk sexual practices to reduce the spread of sexually transmitted infections (STIs). Sexually active women aged 25 and younger should be checked for Chlamydia, which is a common sexually transmitted infection. Older women with new or multiple partners should also be tested for Chlamydia. Testing for other STIs is recommended if you are sexually active and at increased risk.  Osteoporosis is a   disease in which the bones lose minerals and strength with aging. This can result in serious bone fractures. The risk of osteoporosis can be identified using a bone density scan. Women ages 65 and over and women at risk for fractures or osteoporosis should discuss screening with their caregivers. Ask your caregiver whether you should be taking a calcium supplement or vitamin D to reduce the rate of osteoporosis.  Menopause can be associated with physical symptoms and risks. Hormone replacement therapy is available to decrease symptoms and risks. You should talk to your caregiver about whether hormone replacement therapy is right for you.  Use sunscreen with a sun protection factor (SPF) of 30 or greater. Apply sunscreen liberally and repeatedly throughout the day. You should seek shade when your shadow is shorter than you. Protect yourself by wearing long sleeves, pants, a wide-brimmed hat, and sunglasses year round, whenever you are outdoors.  Notify your caregiver of new  moles or changes in moles, especially if there is a change in shape or color. Also notify your caregiver if a mole is larger than the size of a pencil eraser.  Stay current with your immunizations. Document Released: 09/08/2010 Document Revised: 05/18/2011 Document Reviewed: 09/08/2010 ExitCare Patient Information 2013 [employer].  

## 2011-12-18 ENCOUNTER — Encounter: Payer: Self-pay | Admitting: Gynecology

## 2012-01-01 ENCOUNTER — Other Ambulatory Visit: Payer: Self-pay | Admitting: Gynecology

## 2012-01-01 DIAGNOSIS — Z1231 Encounter for screening mammogram for malignant neoplasm of breast: Secondary | ICD-10-CM

## 2012-02-05 ENCOUNTER — Ambulatory Visit: Payer: BC Managed Care – PPO

## 2012-04-12 ENCOUNTER — Ambulatory Visit
Admission: RE | Admit: 2012-04-12 | Discharge: 2012-04-12 | Disposition: A | Discharge status: Home / Self Care | Payer: 59 | Source: Ambulatory Visit | Attending: Gynecology | Admitting: Gynecology

## 2012-04-12 DIAGNOSIS — Z1231 Encounter for screening mammogram for malignant neoplasm of breast: Secondary | ICD-10-CM

## 2012-12-28 ENCOUNTER — Encounter: Payer: Self-pay | Admitting: Women's Health

## 2012-12-28 ENCOUNTER — Other Ambulatory Visit (HOSPITAL_COMMUNITY)
Admission: RE | Admit: 2012-12-28 | Discharge: 2012-12-28 | Disposition: A | Discharge status: Home / Self Care | Payer: 59 | Source: Ambulatory Visit | Attending: Gynecology | Admitting: Gynecology

## 2012-12-28 ENCOUNTER — Ambulatory Visit (INDEPENDENT_AMBULATORY_CARE_PROVIDER_SITE_OTHER): Payer: 59 | Admitting: Women's Health

## 2012-12-28 VITALS — BP 140/90 | Ht 64.0 in | Wt 185.0 lb

## 2012-12-28 DIAGNOSIS — N390 Urinary tract infection, site not specified: Secondary | ICD-10-CM

## 2012-12-28 DIAGNOSIS — Z01419 Encounter for gynecological examination (general) (routine) without abnormal findings: Secondary | ICD-10-CM | POA: Insufficient documentation

## 2012-12-28 LAB — URINALYSIS W MICROSCOPIC + REFLEX CULTURE
Bilirubin Urine: NEGATIVE
Casts: NONE SEEN
Crystals: NONE SEEN
Glucose, UA: NEGATIVE mg/dL
Ketones, ur: NEGATIVE mg/dL
Nitrite: NEGATIVE
Specific Gravity, Urine: >1.03 — ABNORMAL HIGH (ref 1.005–1.030)
Urobilinogen, UA: 0.2 mg/dL (ref 0.0–1.0)
pH: 5.5 (ref 5.0–8.0)

## 2012-12-28 MED ORDER — SULFAMETHOXAZOLE-TRIMETHOPRIM 800-160 MG PO TABS: 1.0000 | ORAL_TABLET | Freq: Two times a day (BID) | ORAL | Status: DC

## 2012-12-28 NOTE — Progress Notes (Signed)
[name] [name] Apr 10, 1965 [id number]    History:    The patient presents for annual exam.  Monthly 3-4 days cycle/BTL. Normal Pap and mammogram history. Hypertension and hypothyroidism managed by primary care. Complained of increased urinary frequency with urgency and pain at end of stream for one day, noted blood in urine today. UA: Moderate leukocytes, TNTC WBCs, TNTC RBCs, many bacteria. Reports never having a UTI in the past.   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 History of GDM.Parents hypertension, mother diabetes.   ROS:  A  ROS was performed and pertinent positives and negatives are included in the history.  Exam:  Filed Vitals:   12/28/12 0810  BP: 140/90    General appearance:  Normal Head/Neck:  Normal, without cervical or supraclavicular adenopathy. Thyroid:  Symmetrical, normal in size, without palpable masses or nodularity. Respiratory  Effort:  Normal  Auscultation:  Clear without wheezing or rhonchi Cardiovascular  Auscultation:  Regular rate, without rubs, murmurs or gallops  Edema/varicosities:  Not grossly evident Abdominal  Soft,nontender, without masses, guarding or rebound.  Liver/spleen:  No organomegaly noted  Hernia:  None appreciated  Skin  Inspection:  Grossly normal  Palpation:  Grossly normal Neurologic/psychiatric  Orientation:  Normal with appropriate conversation.  Mood/affect:  Normal  Genitourinary    Breasts: Examined lying and sitting.     Right: Without masses, retractions, discharge or axillary adenopathy.     Left: Without masses, retractions, discharge or axillary adenopathy.   Inguinal/mons:  Normal without inguinal adenopathy  External genitalia:  Normal  BUS/Urethra/Skene's glands:  Normal  Bladder:  Normal  Vagina:  Normal  Cervix:   Normal  Uterus:   normal in size, shape and contour.  Midline and mobile  Adnexa/parametria:     Rt: Without masses or tenderness.   Lt: Without masses or tenderness.  Anus and perineum: Normal  Digital rectal exam: Normal sphincter tone without palpated masses or tenderness  Assessment/Plan:  47 y.o. MBF G3P2 for annual exam.    UTI Normal GYN exam/BTL Hypertension/hypothyroid-primary care manages labs and meds  Plan: Reviewed blood pressure up,  has not taken medication yet. Aware of importance of taking daily. Septra twice a day for 3 days #6 prescription, proper use given and reviewed UTI prevention. Instructed to call if no rate leaf of symptoms. SBE's, continue annual mammogram, calcium rich diet, vitamin D D. 1000 daily encouraged. Continue regular exercise, has lost 15 pounds in the past year with diet and exercise.  UA, urine culture pending, Pap. Pap normal 2012, new screening guidelines reviewed.   Harrington Challenger [place], 9:36 AM 12/28/2012

## 2012-12-28 NOTE — Patient Instructions (Signed)

## 2012-12-30 LAB — URINE CULTURE: Colony Count: 30000

## 2013-04-05 ENCOUNTER — Other Ambulatory Visit: Payer: Self-pay

## 2013-04-05 DIAGNOSIS — Z1231 Encounter for screening mammogram for malignant neoplasm of breast: Secondary | ICD-10-CM

## 2013-04-20 ENCOUNTER — Ambulatory Visit
Admission: RE | Admit: 2013-04-20 | Discharge: 2013-04-20 | Disposition: A | Discharge status: Home / Self Care | Payer: 59 | Source: Ambulatory Visit

## 2013-04-20 DIAGNOSIS — Z1231 Encounter for screening mammogram for malignant neoplasm of breast: Secondary | ICD-10-CM

## 2014-01-08 ENCOUNTER — Encounter: Payer: Self-pay | Admitting: Women's Health

## 2014-01-09 ENCOUNTER — Ambulatory Visit (INDEPENDENT_AMBULATORY_CARE_PROVIDER_SITE_OTHER): Payer: 59 | Admitting: Women's Health

## 2014-01-09 ENCOUNTER — Encounter: Payer: Self-pay | Admitting: Women's Health

## 2014-01-09 ENCOUNTER — Other Ambulatory Visit (HOSPITAL_COMMUNITY)
Admission: RE | Admit: 2014-01-09 | Discharge: 2014-01-09 | Disposition: A | Discharge status: Home / Self Care | Payer: 59 | Source: Ambulatory Visit | Attending: Women's Health | Admitting: Women's Health

## 2014-01-09 VITALS — BP 126/80 | Ht 63.0 in | Wt 196.0 lb

## 2014-01-09 DIAGNOSIS — Z01419 Encounter for gynecological examination (general) (routine) without abnormal findings: Secondary | ICD-10-CM

## 2014-01-09 DIAGNOSIS — Z1151 Encounter for screening for human papillomavirus (HPV): Secondary | ICD-10-CM | POA: Insufficient documentation

## 2014-01-09 DIAGNOSIS — N852 Hypertrophy of uterus: Secondary | ICD-10-CM

## 2014-01-09 NOTE — Progress Notes (Signed)
[name] [name] December 07, 1965 [id number]    History:    Presents for annual exam.  Cycle every 2-3 months with light spotting. BTL. Normal Pap and mammogram history. Hypertension and hypothyroidism managed by primary care. Complained of increased abdominal bloating. Had lost 15lbs last year, but has gained it back this year because of poor diet and lack of exercise.   Past medical history, past surgical history, family history and social history were all reviewed and documented in the EPIC chart.  Daughter in 69rd grade, son Junior in college, doing well. 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 History of GDM. Parents hypertension, mother diabetes  ROS:  A  12 point ROS was performed and pertinent positives and negatives are included.  Exam:  Filed Vitals:   [birth year]09[birth year] 0849  BP: 126/80    General appearance:  Normal Thyroid:  Symmetrical, normal in size, without palpable masses or nodularity. Respiratory  Auscultation:  Clear without wheezing or rhonchi Cardiovascular  Auscultation:  Regular rate, without rubs, murmurs or gallops  Edema/varicosities:  Not grossly evident Abdominal  Soft,nontender, without masses, guarding or rebound.  Liver/spleen:  No organomegaly noted  Hernia:  None appreciated  Skin  Inspection:  Grossly normal   Breasts: Examined lying and sitting.     Right: Without masses, retractions, discharge or axillary adenopathy.     Left: Without masses, retractions, discharge or axillary adenopathy. Gentitourinary   Inguinal/mons:  Normal without inguinal adenopathy  External genitalia:  Normal  BUS/Urethra/Skene's glands:  Normal  Vagina:  Normal  Cervix:  Normal  Uterus:  Enlarged and bulky, 8-10 week,  shape and contour.  Midline and mobile  Adnexa/parametria:     Rt: Without masses or tenderness.   Lt: Without masses or tenderness.  Anus and perineum: Normal  Digital rectal exam: Normal sphincter tone without palpated masses or  tenderness  Assessment/Plan:  48 y.o.  G2P2 MBF for annual exam.    Normal GYN exam/BTL/light cycle every 2-3 months Hypertension/hypothyroid-primary care manages labs and meds Enlarged uterus   Plan:  SBE's, continue annual mammogram, recommended 3D tomography due to dense breasts, calcium rich diet, vitamin D 1000 daily encouraged. Reviewed importance of regular exercise and heart healthy diet,decrease calories for weight loss.Pap with HPV typing. Pap normal 2014, new screening guidelines reviewed, history of HPV years ago. Schedule TV [name] [facility], 9:25 AM 01/09/2014

## 2014-01-09 NOTE — Patient Instructions (Signed)

## 2014-01-10 LAB — URINALYSIS W MICROSCOPIC + REFLEX CULTURE
Bacteria, UA: NONE SEEN
Bilirubin Urine: NEGATIVE
CRYSTALS: NONE SEEN
Casts: NONE SEEN
Glucose, UA: NEGATIVE mg/dL
Hgb urine dipstick: NEGATIVE
Ketones, ur: NEGATIVE mg/dL
Leukocytes, UA: NEGATIVE
NITRITE: NEGATIVE
Protein, ur: NEGATIVE mg/dL
SPECIFIC GRAVITY, URINE: 1.023 (ref 1.005–1.030)
Urobilinogen, UA: 0.2 mg/dL (ref 0.0–1.0)
pH: 5.5 (ref 5.0–8.0)

## 2014-01-11 LAB — CYTOLOGY - PAP

## 2014-01-24 ENCOUNTER — Other Ambulatory Visit: Payer: Self-pay | Admitting: Women's Health

## 2014-01-24 ENCOUNTER — Ambulatory Visit (INDEPENDENT_AMBULATORY_CARE_PROVIDER_SITE_OTHER): Payer: 59

## 2014-01-24 ENCOUNTER — Encounter: Payer: Self-pay | Admitting: Women's Health

## 2014-01-24 ENCOUNTER — Ambulatory Visit (INDEPENDENT_AMBULATORY_CARE_PROVIDER_SITE_OTHER): Payer: 59 | Admitting: Women's Health

## 2014-01-24 VITALS — BP 130/78 | Ht 63.0 in | Wt 196.0 lb

## 2014-01-24 DIAGNOSIS — N8320 Unspecified ovarian cysts: Secondary | ICD-10-CM

## 2014-01-24 DIAGNOSIS — N852 Hypertrophy of uterus: Secondary | ICD-10-CM

## 2014-01-24 DIAGNOSIS — N831 Corpus luteum cyst of ovary, unspecified side: Secondary | ICD-10-CM

## 2014-01-24 DIAGNOSIS — R1900 Intra-abdominal and pelvic swelling, mass and lump, unspecified site: Secondary | ICD-10-CM

## 2014-01-24 DIAGNOSIS — R1031 Right lower quadrant pain: Secondary | ICD-10-CM

## 2014-01-24 DIAGNOSIS — N83202 Unspecified ovarian cyst, left side: Secondary | ICD-10-CM

## 2014-01-24 DIAGNOSIS — K8020 Calculus of gallbladder without cholecystitis without obstruction: Secondary | ICD-10-CM

## 2014-01-24 DIAGNOSIS — D251 Intramural leiomyoma of uterus: Secondary | ICD-10-CM

## 2014-01-24 DIAGNOSIS — N838 Other noninflammatory disorders of ovary, fallopian tube and broad ligament: Secondary | ICD-10-CM

## 2014-01-24 DIAGNOSIS — N839 Noninflammatory disorder of ovary, fallopian tube and broad ligament, unspecified: Secondary | ICD-10-CM

## 2014-01-24 DIAGNOSIS — N83201 Unspecified ovarian cyst, right side: Secondary | ICD-10-CM

## 2014-01-24 NOTE — Patient Instructions (Signed)
Abdominal Hysterectomy Abdominal hysterectomy is a surgical procedure to remove your womb (uterus). Your uterus is the muscular organ that contains a developing baby. This surgery is done for many reasons. You may need an abdominal hysterectomy if you have cancer, growths (tumors), long-term pain, or bleeding. You may also have this procedure if your uterus has slipped down into your vagina (uterine prolapse). Depending on why you need an abdominal hysterectomy, you may also have other reproductive organs removed. These could include the part of your vagina that connects with your uterus (cervix), the organs that make eggs (ovaries), and the tubes that connect the ovaries to the uterus (fallopian tubes). LET YOUR HEALTH CARE PROVIDER KNOW ABOUT:   Any allergies you have.  All medicines you are taking, including vitamins, herbs, eye drops, creams, and over-the-counter medicines.  Previous problems you or members of your family have had with the use of anesthetics.  Any blood disorders you have.  Previous surgeries you have had.  Medical conditions you have. RISKS AND COMPLICATIONS Generally, this is a safe procedure. However, as with any procedure, problems can occur. Infection is the most common problem after an abdominal hysterectomy. Other possible problems include:  Bleeding.  Formation of blood clots that may break free and travel to your lungs.  Injury to other organs near your uterus.  Nerve injury causing nerve pain.  Decreased interest in sex or pain during sexual intercourse. BEFORE THE PROCEDURE  Abdominal hysterectomy is a major surgical procedure. It can affect the way you feel about yourself. Talk to your health care provider about the physical and emotional changes hysterectomy may cause.  You may need to have blood work and X-rays done before surgery.  Quit smoking if you smoke. Ask your health care provider for help if you are struggling to quit.  Stop taking  medicines that thin your blood as directed by your health care provider.  You may be instructed to take antibiotic medicines or laxatives before surgery.  Do not eat or drink anything for 6-8 hours before surgery.  Take your regular medicines with a small sip of water.  Bathe or shower the night or morning before surgery. PROCEDURE  Abdominal hysterectomy is done in the operating room at the hospital.  In most cases, you will be given a medicine that makes you go to sleep (general anesthetic).  The surgeon will make a cut (incision) through the skin in your lower belly.  The incision may be about 5-7 inches long. It may go side-to-side or up-and-down.  The surgeon will move aside the body tissue that covers your uterus. The surgeon will then carefully take out your uterus along with any of your other reproductive organs that need to be removed.  Bleeding will be controlled with clamps or sutures.  The surgeon will close your incision with sutures or metal clips. AFTER THE PROCEDURE  You will have some pain immediately after the procedure.  You will be given pain medicine in the recovery room.  You will be taken to your hospital room when you have recovered from the anesthesia.  You may need to stay in the hospital for 2-5 days.  You will be given instructions for recovery at home. Document Released: 02/28/2013 Document Reviewed: 02/28/2013 ExitCare Patient Information 2015 [employer]. This information is not intended to replace advice given to you by your health care provider. Make sure you discuss any questions you have with your health care provider.  Ovarian Cyst An ovarian cyst is   a fluid-filled sac that forms on an ovary. The ovaries are small organs that produce eggs in women. Various types of cysts can form on the ovaries. Most are not cancerous. Many do not cause problems, and they often go away on their own. Some may cause symptoms and require treatment. Common  types of ovarian cysts include:  Functional cysts--These cysts may occur every month during the menstrual cycle. This is normal. The cysts usually go away with the next menstrual cycle if the woman does not get pregnant. Usually, there are no symptoms with a functional cyst.  Endometrioma cysts--These cysts form from the tissue that lines the uterus. They are also called "chocolate cysts" because they become filled with blood that turns brown. This type of cyst can cause pain in the lower abdomen during intercourse and with your menstrual period.  Cystadenoma cysts--This type develops from the cells on the outside of the ovary. These cysts can get very big and cause lower abdomen pain and pain with intercourse. This type of cyst can twist on itself, cut off its blood supply, and cause severe pain. It can also easily rupture and cause a lot of pain.  Dermoid cysts--This type of cyst is sometimes found in both ovaries. These cysts may contain different kinds of body tissue, such as skin, teeth, hair, or cartilage. They usually do not cause symptoms unless they get very big.  Theca lutein cysts--These cysts occur when too much of a certain hormone (human chorionic gonadotropin) is produced and overstimulates the ovaries to produce an egg. This is most common after procedures used to assist with the conception of a baby (in vitro fertilization). CAUSES   Fertility drugs can cause a condition in which multiple large cysts are formed on the ovaries. This is called ovarian hyperstimulation syndrome.  A condition called polycystic ovary syndrome can cause hormonal imbalances that can lead to nonfunctional ovarian cysts. SIGNS AND SYMPTOMS  Many ovarian cysts do not cause symptoms. If symptoms are present, they may include:  Pelvic pain or pressure.  Pain in the lower abdomen.  Pain during sexual intercourse.  Increasing girth (swelling) of the abdomen.  Abnormal menstrual periods.  Increasing  pain with menstrual periods.  Stopping having menstrual periods without being pregnant. DIAGNOSIS  These cysts are commonly found during a routine or annual pelvic exam. Tests may be ordered to find out more about the cyst. These tests may include:  Ultrasound.  X-ray of the pelvis.  CT scan.  MRI.  Blood tests. TREATMENT  Many ovarian cysts go away on their own without treatment. Your health care provider may want to check your cyst regularly for 2-3 months to see if it changes. For women in menopause, it is particularly important to monitor a cyst closely because of the higher rate of ovarian cancer in menopausal women. When treatment is needed, it may include any of the following:  A procedure to drain the cyst (aspiration). This may be done using a long needle and ultrasound. It can also be done through a laparoscopic procedure. This involves using a thin, lighted tube with a tiny camera on the end (laparoscope) inserted through a small incision.  Surgery to remove the whole cyst. This may be done using laparoscopic surgery or an open surgery involving a larger incision in the lower abdomen.  Hormone treatment or birth control pills. These methods are sometimes used to help dissolve a cyst. HOME CARE INSTRUCTIONS   Only take over-the-counter or prescription medicines as directed   by your health care provider.  Follow up with your health care provider as directed.  Get regular pelvic exams and Pap tests. SEEK MEDICAL CARE IF:   Your periods are late, irregular, or painful, or they stop.  Your pelvic pain or abdominal pain does not go away.  Your abdomen becomes larger or swollen.  You have pressure on your bladder or trouble emptying your bladder completely.  You have pain during sexual intercourse.  You have feelings of fullness, pressure, or discomfort in your stomach.  You lose weight for no apparent reason.  You feel generally ill.  You become constipated.  You  lose your appetite.  You develop acne.  You have an increase in body and facial hair.  You are gaining weight, without changing your exercise and eating habits.  You think you are pregnant. SEEK IMMEDIATE MEDICAL CARE IF:   You have increasing abdominal pain.  You feel sick to your stomach (nauseous), and you throw up (vomit).  You develop a fever that comes on suddenly.  You have abdominal pain during a bowel movement.  Your menstrual periods become heavier than usual. MAKE SURE YOU:  Understand these instructions.  Will watch your condition.  Will get help right away if you are not doing well or get worse. Document Released: 02/23/2005 Document Revised: 02/28/2013 Document Reviewed: 10/31/2012 ExitCare Patient Information 2015 [employer]. This information is not intended to replace advice given to you by your health care provider. Make sure you discuss any questions you have with your health care provider.  

## 2014-01-24 NOTE — Progress Notes (Signed)
Patient ID: [name] [name], female   DOB: 07/23/1965, 48 y.o.   MRN: [id number] Presents for ultrasound, at annual exam noted enlarged uterus, having some irregular cycles. BTL. Has had problems with indigestion, heartburn and intermittent right upper quadrant pain. Hypertension and hypothyroid managed by primary care.  Ultrasound: Transvaginal and transabdominal enlarged uterine fibroids 36 x 26 mm, 15 x 17 mm, 53 x 4246 mm, right cystic irregular solid mass 8.9 x 6.9 x 9 cm positive arterial flow to mass, thick-walled. Left ovary thick-walled cyst 27 x 16 mm echo-free cyst 26 x 26 mm with cyst within cyst 10 mm. Free fluid above the uterus 39 x 12 mm, gallbladder stones noted within gallbladder.  Fibroid uterus Right ovarian solid mass/ probable dermoid Left ovary functional cysts Gallstones  Plan: CA-125, reviewed surgical consult with Dr. Phineas Real for possible hysterectomy and right cystectomy/salpingectomy. History of a left ectopic, fallopian tube removed.

## 2014-01-25 LAB — CA 125: CA 125: 10 U/mL (ref <35)

## 2014-03-23 ENCOUNTER — Ambulatory Visit: Payer: 59 | Admitting: Gynecology

## 2014-03-28 ENCOUNTER — Other Ambulatory Visit: Payer: Self-pay

## 2014-03-28 DIAGNOSIS — Z1231 Encounter for screening mammogram for malignant neoplasm of breast: Secondary | ICD-10-CM

## 2014-04-18 ENCOUNTER — Telehealth: Payer: Self-pay | Admitting: *Deleted

## 2014-04-18 ENCOUNTER — Ambulatory Visit (INDEPENDENT_AMBULATORY_CARE_PROVIDER_SITE_OTHER): Payer: Commercial Managed Care - PPO | Admitting: Gynecology

## 2014-04-18 ENCOUNTER — Encounter: Payer: Self-pay | Admitting: Gynecology

## 2014-04-18 VITALS — BP 130/80

## 2014-04-18 DIAGNOSIS — N8320 Unspecified ovarian cysts: Secondary | ICD-10-CM

## 2014-04-18 DIAGNOSIS — K8020 Calculus of gallbladder without cholecystitis without obstruction: Secondary | ICD-10-CM

## 2014-04-18 DIAGNOSIS — N83201 Unspecified ovarian cyst, right side: Secondary | ICD-10-CM

## 2014-04-18 DIAGNOSIS — R140 Abdominal distension (gaseous): Secondary | ICD-10-CM

## 2014-04-18 DIAGNOSIS — N926 Irregular menstruation, unspecified: Secondary | ICD-10-CM

## 2014-04-18 DIAGNOSIS — D251 Intramural leiomyoma of uterus: Secondary | ICD-10-CM

## 2014-04-18 LAB — CBC WITH DIFFERENTIAL/PLATELET
BASOS ABS: 0 10*3/uL (ref 0.0–0.1)
BASOS PCT: 0 % (ref 0–1)
EOS ABS: 0.1 10*3/uL (ref 0.0–0.7)
EOS PCT: 3 % (ref 0–5)
HCT: 33.5 % — ABNORMAL LOW (ref 36.0–46.0)
Hemoglobin: 11.2 g/dL — ABNORMAL LOW (ref 12.0–15.0)
Lymphocytes Relative: 33 % (ref 12–46)
Lymphs Abs: 1.1 10*3/uL (ref 0.7–4.0)
MCH: 29.7 pg (ref 26.0–34.0)
MCHC: 33.4 g/dL (ref 30.0–36.0)
MCV: 88.9 fL (ref 78.0–100.0)
MONO ABS: 0.3 10*3/uL (ref 0.1–1.0)
MPV: 11.9 fL (ref 8.6–12.4)
Monocytes Relative: 8 % (ref 3–12)
NEUTROS ABS: 1.8 10*3/uL (ref 1.7–7.7)
Neutrophils Relative %: 56 % (ref 43–77)
PLATELETS: 189 10*3/uL (ref 150–400)
RBC: 3.77 MIL/uL — AB (ref 3.87–5.11)
RDW: 14.2 % (ref 11.5–15.5)
WBC: 3.3 10*3/uL — AB (ref 4.0–10.5)

## 2014-04-18 LAB — TSH: TSH: 2.597 u[IU]/mL (ref 0.350–4.500)

## 2014-04-18 LAB — FOLLICLE STIMULATING HORMONE: FSH: 25.3 m[IU]/mL

## 2014-04-18 NOTE — Telephone Encounter (Signed)
-----   Message from [name], MD sent at 04/18/2014  9:51 AM EST ----- Schedule upper abdominal ultrasound at the hospital reference bloating with GYN ultrasound showing gallstones.

## 2014-04-18 NOTE — Telephone Encounter (Signed)
Appointment on 04/19/14 @ 7:30am at [facility] hospital, must be NPO after midnight. Left message for pt to call.

## 2014-04-18 NOTE — Patient Instructions (Signed)
Office will help you schedule gallbladder ultrasound. Assuming it does confirm gallstones I recommend following up with a general surgeon to discuss possible surgery versus observation.  Follow up for GYN ultrasound as scheduled.

## 2014-04-18 NOTE — Progress Notes (Signed)
[name] [name] [birth year] [id number]        48 y.o.  [id number] Presents in follow up having had an ultrasound 01/2014 due to bloating in November which showed an enlarged uterus with multiple myomas the largest measuring 53 mm. Right adnexa with cystic/solid area 9 cm with positive arterial flow questionable dermoid. Left ovary with several small physiologic cystic changes. Also noted was cholelithiasis with gallbladder looked at by ultrasound tech apparently due to her complaining of upper abdominal bloating and discomfort.  CA-125 10. Patient was recommended to schedule an appointment to talk to me about surgical options. She follows up now for this. Is not having significant discomfort or symptoms. Menses are every 30-40 days relatively light without significant cramping. Does note some upper abdominal bloating every once in a while but not consistently. Does not notice significant right upper quadrant pain after eating.  Past medical history,surgical history, problem list, medications, allergies, family history and social history were all reviewed and documented in the EPIC chart.  Directed ROS with pertinent positives and negatives documented in the history of present illness/assessment and plan.  Exam: Kim assistant General appearance:  Normal Abdomen soft nontender without masses guarding rebound or organomegaly. Pelvic external BUS vagina normal. Cervix normal. Uterus irregular approximately ten-week size. Midline mobile nontender. Adnexa without masses or tenderness  Assessment/Plan:  49 y.o. [id number] with history as above. 2 issues:  1. Some bloating symptoms with ultrasound showing cholelithiasis. Reviewed with her as gynecologists prefer patient to have a formal upper abdominal ultrasound by radiology and interpretation. If indeed confirms gallstones then recommended appointment with general surgeon to at least review options to include expectant versus surgery. Patient agrees and  we'll help arrange this. 2. Cystic solid mass right ovary suspicious for dermoid November 2015. Also several myomas. Patient does not appear significantly symptomatic from her myomas with relatively regular although somewhat spaced out menses. Discussed differential as far as the ovarian process to include benign versus malignant. Recommended follow up ultrasound now to relook at this area as it has been 3 months. Various scenarios to include expectant versus interventional surgery reviewed. Possible MRI for better definition if ultrasound is not clear. Patient will follow up with me after she schedules the ultrasound. She did ask me how longi wait before doing this and I again reviewed with her the differential includes ovarian malignancy and that if she does postpone follow up she will except the risks of this.  I also ordered baseline CBC TSH FSH due to her spacing out the upper menses.     [name] MD, 9:41 AM 04/18/2014

## 2014-04-18 NOTE — Telephone Encounter (Signed)
Pt informed with the below note. 

## 2014-04-19 ENCOUNTER — Ambulatory Visit (HOSPITAL_COMMUNITY): Payer: Commercial Managed Care - PPO

## 2014-04-23 ENCOUNTER — Ambulatory Visit: Payer: Self-pay

## 2014-04-24 ENCOUNTER — Ambulatory Visit
Admission: RE | Admit: 2014-04-24 | Discharge: 2014-04-24 | Disposition: A | Discharge status: Home / Self Care | Payer: Commercial Managed Care - PPO | Source: Ambulatory Visit

## 2014-04-24 DIAGNOSIS — Z1231 Encounter for screening mammogram for malignant neoplasm of breast: Secondary | ICD-10-CM

## 2014-04-25 ENCOUNTER — Encounter: Payer: Self-pay | Admitting: Women's Health

## 2014-04-26 ENCOUNTER — Ambulatory Visit (INDEPENDENT_AMBULATORY_CARE_PROVIDER_SITE_OTHER): Payer: Commercial Managed Care - PPO

## 2014-04-26 ENCOUNTER — Other Ambulatory Visit: Payer: Self-pay | Admitting: Gynecology

## 2014-04-26 ENCOUNTER — Ambulatory Visit (INDEPENDENT_AMBULATORY_CARE_PROVIDER_SITE_OTHER): Payer: Commercial Managed Care - PPO | Admitting: Gynecology

## 2014-04-26 ENCOUNTER — Encounter: Payer: Self-pay | Admitting: Gynecology

## 2014-04-26 DIAGNOSIS — N839 Noninflammatory disorder of ovary, fallopian tube and broad ligament, unspecified: Secondary | ICD-10-CM

## 2014-04-26 DIAGNOSIS — N83201 Unspecified ovarian cyst, right side: Secondary | ICD-10-CM

## 2014-04-26 DIAGNOSIS — D251 Intramural leiomyoma of uterus: Secondary | ICD-10-CM

## 2014-04-26 DIAGNOSIS — N852 Hypertrophy of uterus: Secondary | ICD-10-CM

## 2014-04-26 DIAGNOSIS — N8320 Unspecified ovarian cysts: Secondary | ICD-10-CM

## 2014-04-26 DIAGNOSIS — N838 Other noninflammatory disorders of ovary, fallopian tube and broad ligament: Secondary | ICD-10-CM

## 2014-04-26 NOTE — Progress Notes (Signed)
[name] [name] 1965/07/04 [id number]        49 y.o.  [id number] Presents for follow up ultrasound. Had history of bloating leading to ultrasound in November which showed no large uterus with multiple myomas and a cystic solid mass right ovary 48 mm mean suspicious for dermoid. Patient was also found to have incidental gallstones which probably was the etiology of her bloating. Has appointment for upper abdominal ultrasound next week and will probably refer to general surgeon for discussion about cholecystectomy pending these results.  Presents now for follow up ultrasound in reference to the cystic mass on her ovary.  Past medical history,surgical history, problem list, medications, allergies, family history and social history were all reviewed and documented in the EPIC chart.  Directed ROS with pertinent positives and negatives documented in the history of present illness/assessment and plan.  Ultrasound shows uterus enlarged with endometrial echo 7.5 mm. Multiple myomas largest 50 mm. Right ovarian mass cystic solid 48 mm mean with arterial blood flow. Left ovary normal. Cul-de-sac negative  Assessment/Plan:  49 y.o. [id number] with persistent right ovarian cystic solid mass although smaller in size from prior measurement. Suspicious for dermoid. CA-125 was 10. Does not appear to have significant symptoms from her leiomyoma to include heavy periods cramping or pressure. Her menses are relatively light with recent [place] 25. I reviewed various scenarios with her to include TAH/BSO, right salpingo-oophorectomy, observation. Lack of histology to guarantee it is not malignant was discussed. Serial ultrasounds certainly do not show enlargement and if anything a smaller size. At this point the patient prefers observation. She again clearly understands I cannot guarantee it is not a malignancy but she is comfortable following it at least for now. Recommend repeat ultrasound in 3 months with ASAP follow up  precautions if any symptoms develop. Patient agrees with the plan.     [name] MD, 4:46 PM 04/26/2014

## 2014-04-26 NOTE — Patient Instructions (Signed)
Follow up in 3 months for repeat ultrasound as we discussed. Sooner if any issues.

## 2014-05-09 ENCOUNTER — Other Ambulatory Visit: Payer: Commercial Managed Care - PPO

## 2014-05-09 ENCOUNTER — Ambulatory Visit: Payer: Commercial Managed Care - PPO | Admitting: Gynecology

## 2014-05-15 ENCOUNTER — Ambulatory Visit (HOSPITAL_COMMUNITY): Payer: Commercial Managed Care - PPO

## 2014-10-22 ENCOUNTER — Ambulatory Visit (INDEPENDENT_AMBULATORY_CARE_PROVIDER_SITE_OTHER): Payer: BLUE CROSS/BLUE SHIELD

## 2014-10-22 ENCOUNTER — Telehealth: Payer: Self-pay | Admitting: *Deleted

## 2014-10-22 ENCOUNTER — Ambulatory Visit (INDEPENDENT_AMBULATORY_CARE_PROVIDER_SITE_OTHER): Payer: BLUE CROSS/BLUE SHIELD | Admitting: Gynecology

## 2014-10-22 ENCOUNTER — Other Ambulatory Visit: Payer: Self-pay | Admitting: Gynecology

## 2014-10-22 ENCOUNTER — Encounter: Payer: Self-pay | Admitting: Gynecology

## 2014-10-22 VITALS — BP 120/78

## 2014-10-22 DIAGNOSIS — D251 Intramural leiomyoma of uterus: Secondary | ICD-10-CM

## 2014-10-22 DIAGNOSIS — N83201 Unspecified ovarian cyst, right side: Secondary | ICD-10-CM

## 2014-10-22 DIAGNOSIS — N8320 Unspecified ovarian cysts: Secondary | ICD-10-CM

## 2014-10-22 DIAGNOSIS — N838 Other noninflammatory disorders of ovary, fallopian tube and broad ligament: Secondary | ICD-10-CM

## 2014-10-22 DIAGNOSIS — N839 Noninflammatory disorder of ovary, fallopian tube and broad ligament, unspecified: Secondary | ICD-10-CM | POA: Diagnosis not present

## 2014-10-22 DIAGNOSIS — K8020 Calculus of gallbladder without cholecystitis without obstruction: Secondary | ICD-10-CM

## 2014-10-22 NOTE — Telephone Encounter (Signed)
Appointment 10/24/14 at [facility].

## 2014-10-22 NOTE — Progress Notes (Addendum)
[name] [name] [birth year] [id number]        48 y.o.  [id number] presents for follow up ultrasound.  History of some bloating leading to pelvic ultrasound in November 2015 which showed a cystic solid mass in the right ovary 94 mm. Suspicious for dermoid. Also found to have incidental gall stones.  CA 125 10. Also showed multiple myomas 53 mm, 36 mm, 17 mm.  Follow up ultrasound February 2016 right ovary 68 mm. Patient elected expectant management having no symptoms after counseling for surgery. Cannot guarantee not cancer also discussed. Presents now for follow up ultrasound.  Past medical history,surgical history, problem list, medications, allergies, family history and social history were all reviewed and documented in the EPIC chart.  Directed ROS with pertinent positives and negatives documented in the history of present illness/assessment and plan.  Exam: Filed Vitals:   10/22/14 0913  BP: 120/78   General appearance:  Normal  Ultrasound shows uterus enlarged with several myomas measuring 53 mm, 29 mm, 26 mm. Endometrial echo 1.6 mm. Right ovary with cystic/solid mass 62 x 75 x 45 mm. 61 mm mean. Left ovary normal. Cul-de-sac negative.  Assessment/Plan:  49 y.o. [id number] with:  1. Probable right ovarian dermoid stable on serial ultrasound. Patient asymptomatic historically without pelvic pain or dyspareunia. Also with multiple small myomas with relatively light menses and no irregular bleeding. Options for management reviewed to include continued expectant management with repeat ultrasound in 6 months versus surgery to include possible RSO versus RSO/hysterectomy given the myomas. Issues of hysterectomy in a patient with myomas and light menses and no pain also discussed from a prophylactic standpoint. Cannot rule out cancer also discussed. At this point patient prefers expectant management and she is asymptomatic and not interested in surgery at this point. Plan repeat ultrasound in 6  months and she knows to call and follow up for this. 2. Probable gallstones. Patient never followed up for ultrasound as discussed and arranged previously. Patient wants to go ahead and schedule now will make arrangements for this with probable referral to general surgery assuming ultrasound confirms gallstones.    [name] MD, 9:33 AM 10/22/2014

## 2014-10-22 NOTE — Patient Instructions (Signed)
Follow up for gallbladder ultrasound as arranged.  Follow up in 6 months for repeat ultrasound, sooner if you decide to proceed with surgery.

## 2014-10-22 NOTE — Telephone Encounter (Signed)
Appointment at [facility] at 8:30am arrive 15 minutes early per to appointment, npo 6 hours prior. Left message for pt to call.

## 2014-10-22 NOTE — Telephone Encounter (Signed)
Patient informed with the below note.  

## 2014-10-22 NOTE — Telephone Encounter (Signed)
-----   Message from [name], MD sent at 10/22/2014  9:38 AM EDT ----- Schedule ultrasound of the gallbladder reference gallstones.

## 2014-10-24 ENCOUNTER — Ambulatory Visit (HOSPITAL_COMMUNITY): Payer: Commercial Managed Care - PPO

## 2014-10-26 ENCOUNTER — Ambulatory Visit (HOSPITAL_COMMUNITY): Payer: BLUE CROSS/BLUE SHIELD

## 2014-12-11 ENCOUNTER — Telehealth: Payer: Self-pay

## 2014-12-11 NOTE — Telephone Encounter (Signed)
Patient is looking at scheduling an abdominoplasty with Dr. Towanda Malkin.  She was asking if you could do the surgery to remove the cyst at the same time and coordinate the two together.    I mentioned to her that if she is paying out of pocket for abdominoplasty she might want to check costs between [facility] and [facility] center. I only mentioned that to her because I had a patient of Dr. Durenda Guthrie considering the same thing and she decided to do the two surgeries separately because there was a considerate savings to her to do her abdominoplasty at Dr.Truesdale's center. Patient is going to check on this but then had a related question.  She is going to be out for recovery for six weeks with Abdominoplasty. She asked if she decided to do it at [facility] center if you would do her surgery before or after that at [facility] so that she could recover in that same six weeks?  I let her know Dr. Loetta Rough out of office and I will call her next week when he returns and advises me.

## 2014-12-16 NOTE — Telephone Encounter (Signed)
If we do a combined procedure then I would do the hysterectomy first which I believe would probably be a TAH with BSO/USO.  I would have to reexamine her to consider whether we could try to do it as an LAVH. Dr. Towanda Malkin could follow but from recent experience most of the time they do not like doing this but prefer to do it as a separate procedure. If so, I would not do it within 2 months from the abdominoplasty to allow complete healing before going back through the abdomen. I believe Dr. Towanda Malkin would feel the same but she would have to check with him to see how soon after a hysterectomy he would consider doing an abdominoplasty.

## 2014-12-17 NOTE — Telephone Encounter (Signed)
If we are talking laparoscopic oophorectomy or cystectomy then I could do this 1-2 weeks before her abdominoplasty if Dr. Towanda Malkin is okay with this. Otherwise I could do this 5 weeks after her abdominoplasty which would give her 1 week to recover. She does have to understand though that I may not be able to do it laparoscopically and would then have to make a larger incision which would then be a longer recovery. We never know with laparoscopic cases if surprise findings and or complications arise that would require a larger incision.

## 2014-12-17 NOTE — Telephone Encounter (Signed)
Patient did not want hysterectomy but just laparoscopic cystectomy. That was why she was asking if there was any way possible if she elected to do abdominoplasty at [facility] clinic that you could do ov cystectomy at [facility] in that same 6 week period so she could recover while out for abdominoplasty.

## 2014-12-17 NOTE — Telephone Encounter (Signed)
Patient informed. Patient said she has decided to wait. She cannot do this now but when she does she will do each case separately.  She has u/s and visit already scheduled for 04/21/14 with Dr. Loetta Rough.

## 2014-12-17 NOTE — Telephone Encounter (Signed)
Left message to call.

## 2015-01-08 HISTORY — PX: OTHER SURGICAL HISTORY: SHX169

## 2015-01-15 ENCOUNTER — Other Ambulatory Visit: Payer: Self-pay

## 2015-01-28 ENCOUNTER — Other Ambulatory Visit: Payer: Self-pay

## 2015-01-28 DIAGNOSIS — Z1231 Encounter for screening mammogram for malignant neoplasm of breast: Secondary | ICD-10-CM

## 2015-02-13 ENCOUNTER — Encounter: Payer: BLUE CROSS/BLUE SHIELD | Admitting: Women's Health

## 2015-03-06 ENCOUNTER — Encounter: Payer: BLUE CROSS/BLUE SHIELD | Admitting: Women's Health

## 2015-03-11 ENCOUNTER — Other Ambulatory Visit: Payer: Self-pay | Admitting: Gynecology

## 2015-03-11 DIAGNOSIS — R1900 Intra-abdominal and pelvic swelling, mass and lump, unspecified site: Secondary | ICD-10-CM

## 2015-03-27 ENCOUNTER — Ambulatory Visit (INDEPENDENT_AMBULATORY_CARE_PROVIDER_SITE_OTHER): Payer: BLUE CROSS/BLUE SHIELD | Admitting: Women's Health

## 2015-03-27 ENCOUNTER — Other Ambulatory Visit (HOSPITAL_COMMUNITY)
Admission: RE | Admit: 2015-03-27 | Discharge: 2015-03-27 | Disposition: A | Discharge status: Home / Self Care | Payer: BLUE CROSS/BLUE SHIELD | Source: Ambulatory Visit | Attending: Gynecology | Admitting: Gynecology

## 2015-03-27 ENCOUNTER — Encounter: Payer: Self-pay | Admitting: Women's Health

## 2015-03-27 ENCOUNTER — Encounter: Payer: BLUE CROSS/BLUE SHIELD | Admitting: Women's Health

## 2015-03-27 VITALS — BP 126/80 | Wt 196.0 lb

## 2015-03-27 DIAGNOSIS — N912 Amenorrhea, unspecified: Secondary | ICD-10-CM | POA: Diagnosis not present

## 2015-03-27 DIAGNOSIS — Z01419 Encounter for gynecological examination (general) (routine) without abnormal findings: Secondary | ICD-10-CM | POA: Insufficient documentation

## 2015-03-27 NOTE — Patient Instructions (Signed)
Menopause is a normal process in which your reproductive ability comes to an end. This process happens gradually over a span of months to years, usually between the ages of 48 and 55. Menopause is complete when you have missed 12 consecutive menstrual periods. It is important to talk with your health care provider about some of the most common conditions that affect postmenopausal women, such as heart disease, cancer, and bone loss (osteoporosis). Adopting a healthy lifestyle and getting preventive care can help to promote your health and wellness. Those actions can also lower your chances of developing some of these common conditions. WHAT SHOULD I KNOW ABOUT MENOPAUSE? During menopause, you may experience a number of symptoms, such as:  Moderate-to-severe hot flashes.  Night sweats.  Decrease in sex drive.  Mood swings.  Headaches.  Tiredness.  Irritability.  Memory problems.  Insomnia. Choosing to treat or not to treat menopausal changes is an individual decision that you make with your health care provider. WHAT SHOULD I KNOW ABOUT HORMONE REPLACEMENT THERAPY AND SUPPLEMENTS? Hormone therapy products are effective for treating symptoms that are associated with menopause, such as hot flashes and night sweats. Hormone replacement carries certain risks, especially as you become older. If you are thinking about using estrogen or estrogen with progestin treatments, discuss the benefits and risks with your health care provider. WHAT SHOULD I KNOW ABOUT HEART DISEASE AND STROKE? Heart disease, heart attack, and stroke become more likely as you age. This may be due, in part, to the hormonal changes that your body experiences during menopause. These can affect how your body processes dietary fats, triglycerides, and cholesterol. Heart attack and stroke are both medical emergencies. There are many things that you can do to help prevent heart disease and stroke:  Have your blood pressure  checked at least every 1-2 years. High blood pressure causes heart disease and increases the risk of stroke.  If you are 55-79 years old, ask your health care provider if you should take aspirin to prevent a heart attack or a stroke.  Do not use any tobacco products, including cigarettes, chewing tobacco, or electronic cigarettes. If you need help quitting, ask your health care provider.  It is important to eat a healthy diet and maintain a healthy weight.  Be sure to include plenty of vegetables, fruits, low-fat dairy products, and lean protein.  Avoid eating foods that are high in solid fats, added sugars, or salt (sodium).  Get regular exercise. This is one of the most important things that you can do for your health.  Try to exercise for at least 150 minutes each week. The type of exercise that you do should increase your heart rate and make you sweat. This is known as moderate-intensity exercise.  Try to do strengthening exercises at least twice each week. Do these in addition to the moderate-intensity exercise.  Know your numbers.Ask your health care provider to check your cholesterol and your blood glucose. Continue to have your blood tested as directed by your health care provider. WHAT SHOULD I KNOW ABOUT CANCER SCREENING? There are several types of cancer. Take the following steps to reduce your risk and to catch any cancer development as early as possible. Breast Cancer  Practice breast self-awareness.  This means understanding how your breasts normally appear and feel.  It also means doing regular breast self-exams. Let your health care provider know about any changes, no matter how small.  If you are 40 or older, have a clinician do a   breast exam (clinical breast exam or CBE) every year. Depending on your age, family history, and medical history, it may be recommended that you also have a yearly breast X-ray (mammogram).  If you have a family history of breast cancer,  talk with your health care provider about genetic screening.  If you are at high risk for breast cancer, talk with your health care provider about having an MRI and a mammogram every year.  Breast cancer (BRCA) gene test is recommended for women who have family members with BRCA-related cancers. Results of the assessment will determine the need for genetic counseling and BRCA1 and for BRCA2 testing. BRCA-related cancers include these types:  Breast. This occurs in males or females.  Ovarian.  Tubal. This may also be called fallopian tube cancer.  Cancer of the abdominal or pelvic lining (peritoneal cancer).  Prostate.  Pancreatic. Cervical, Uterine, and Ovarian Cancer Your health care provider may recommend that you be screened regularly for cancer of the pelvic organs. These include your ovaries, uterus, and vagina. This screening involves a pelvic exam, which includes checking for microscopic changes to the surface of your cervix (Pap test).  For women ages 21-65, health care providers may recommend a pelvic exam and a Pap test every three years. For women ages 77-65, they may recommend the Pap test and pelvic exam, combined with testing for human papilloma virus (HPV), every five years. Some types of HPV increase your risk of cervical cancer. Testing for HPV may also be done on women of any age who have unclear Pap test results.  Other health care providers may not recommend any screening for nonpregnant women who are considered low risk for pelvic cancer and have no symptoms. Ask your health care provider if a screening pelvic exam is right for you.  If you have had past treatment for cervical cancer or a condition that could lead to cancer, you need Pap tests and screening for cancer for at least 20 years after your treatment. If Pap tests have been discontinued for you, your risk factors (such as having a new sexual partner) need to be reassessed to determine if you should start having  screenings again. Some women have medical problems that increase the chance of getting cervical cancer. In these cases, your health care provider may recommend that you have screening and Pap tests more often.  If you have a family history of uterine cancer or ovarian cancer, talk with your health care provider about genetic screening.  If you have vaginal bleeding after reaching menopause, tell your health care provider.  There are currently no reliable tests available to screen for ovarian cancer. Lung Cancer Lung cancer screening is recommended for adults 3-70 years old who are at high risk for lung cancer because of a history of smoking. A yearly low-dose CT scan of the lungs is recommended if you:  Currently smoke.  Have a history of at least 30 pack-years of smoking and you currently smoke or have quit within the past 15 years. A pack-year is smoking an average of one pack of cigarettes per day for one year. Yearly screening should:  Continue until it has been 15 years since you quit.  Stop if you develop a health problem that would prevent you from having lung cancer treatment. Colorectal Cancer  This type of cancer can be detected and can often be prevented.  Routine colorectal cancer screening usually begins at age 38 and continues through age 12.  If you have  risk factors for colon cancer, your health care provider may recommend that you be screened at an earlier age.  If you have a family history of colorectal cancer, talk with your health care provider about genetic screening.  Your health care provider may also recommend using home test kits to check for hidden blood in your stool.  A small camera at the end of a tube can be used to examine your colon directly (sigmoidoscopy or colonoscopy). This is done to check for the earliest forms of colorectal cancer.  Direct examination of the colon should be repeated every 5-10 years until age 67. However, if early forms of  precancerous polyps or small growths are found or if you have a family history or genetic risk for colorectal cancer, you may need to be screened more often. Skin Cancer  Check your skin from head to toe regularly.  Monitor any moles. Be sure to tell your health care provider:  About any new moles or changes in moles, especially if there is a change in a mole's shape or color.  If you have a mole that is larger than the size of a pencil eraser.  If any of your family members has a history of skin cancer, especially at a Shandreka Dante age, talk with your health care provider about genetic screening.  Always use sunscreen. Apply sunscreen liberally and repeatedly throughout the day.  Whenever you are outside, protect yourself by wearing long sleeves, pants, a wide-brimmed hat, and sunglasses. WHAT SHOULD I KNOW ABOUT OSTEOPOROSIS? Osteoporosis is a condition in which bone destruction happens more quickly than new bone creation. After menopause, you may be at an increased risk for osteoporosis. To help prevent osteoporosis or the bone fractures that can happen because of osteoporosis, the following is recommended:  If you are 39-61 years old, get at least 1,000 mg of calcium and at least 600 mg of vitamin D per day.  If you are older than age 16 but younger than age 7, get at least 1,200 mg of calcium and at least 600 mg of vitamin D per day.  If you are older than age 47, get at least 1,200 mg of calcium and at least 800 mg of vitamin D per day. Smoking and excessive alcohol intake increase the risk of osteoporosis. Eat foods that are rich in calcium and vitamin D, and do weight-bearing exercises several times each week as directed by your health care provider. WHAT SHOULD I KNOW ABOUT HOW MENOPAUSE AFFECTS [place]? Depression may occur at any age, but it is more common as you become older. Common symptoms of depression include:  Low or sad mood.  Changes in sleep patterns.  Changes  in appetite or eating patterns.  Feeling an overall lack of motivation or enjoyment of activities that you previously enjoyed.  Frequent crying spells. Talk with your health care provider if you think that you are experiencing depression. WHAT SHOULD I KNOW ABOUT IMMUNIZATIONS? It is important that you get and maintain your immunizations. These include:  Tetanus, diphtheria, and pertussis (Tdap) booster vaccine.  Influenza every year before the flu season begins.  Pneumonia vaccine.  Shingles vaccine. Your health care provider may also recommend other immunizations.   This information is not intended to replace advice given to you by your health care provider. Make sure you discuss any questions you have with your health care provider.   Document Released: 04/17/2005 Document Revised: 03/16/2014 Document Reviewed: 10/26/2013 Elsevier Interactive Patient Education 2016 [employer] Ovarian Cyst An ovarian cyst is a fluid-filled sac that forms on an ovary. The ovaries are small organs that produce eggs in women. Various types of cysts can form on the ovaries. Most are not cancerous. Many do not cause problems, and they often go away on their own. Some may cause symptoms and require treatment. Common types of ovarian cysts include:  Functional cysts--These cysts may occur every month during the menstrual cycle. This is normal. The cysts usually go away with the next menstrual cycle if the woman does not get pregnant. Usually, there are no symptoms with a functional cyst.  Endometrioma cysts--These cysts form from the tissue that lines the uterus. They are also called "chocolate cysts" because they become filled with blood that turns brown. This type of cyst can cause pain in the lower abdomen during intercourse and with your menstrual period.  Cystadenoma cysts--This type develops from the cells on the outside of the ovary. These cysts can get very big and cause lower abdomen pain and  pain with intercourse. This type of cyst can twist on itself, cut off its blood supply, and cause severe pain. It can also easily rupture and cause a lot of pain.  Dermoid cysts--This type of cyst is sometimes found in both ovaries. These cysts may contain different kinds of body tissue, such as skin, teeth, hair, or cartilage. They usually do not cause symptoms unless they get very big.  Theca lutein cysts--These cysts occur when too much of a certain hormone (human chorionic gonadotropin) is produced and overstimulates the ovaries to produce an egg. This is most common after procedures used to assist with the conception of a baby (in vitro fertilization). CAUSES   Fertility drugs can cause a condition in which multiple large cysts are formed on the ovaries. This is called ovarian hyperstimulation syndrome.  A condition called polycystic ovary syndrome can cause hormonal imbalances that can lead to nonfunctional ovarian cysts. SIGNS AND SYMPTOMS  Many ovarian cysts do not cause symptoms. If symptoms are present, they may include:  Pelvic pain or pressure.  Pain in the lower abdomen.  Pain during sexual intercourse.  Increasing girth (swelling) of the abdomen.  Abnormal menstrual periods.  Increasing pain with menstrual periods.  Stopping having menstrual periods without being pregnant. DIAGNOSIS  These cysts are commonly found during a routine or annual pelvic exam. Tests may be ordered to find out more about the cyst. These tests may include:  Ultrasound.  X-ray of the pelvis.  CT scan.  MRI.  Blood tests. TREATMENT  Many ovarian cysts go away on their own without treatment. Your health care provider may want to check your cyst regularly for 2-3 months to see if it changes. For women in menopause, it is particularly important to monitor a cyst closely because of the higher rate of ovarian cancer in menopausal women. When treatment is needed, it may include any of the  following:  A procedure to drain the cyst (aspiration). This may be done using a long needle and ultrasound. It can also be done through a laparoscopic procedure. This involves using a thin, lighted tube with a tiny camera on the end (laparoscope) inserted through a small incision.  Surgery to remove the whole cyst. This may be done using laparoscopic surgery or an open surgery involving a larger incision in the lower abdomen.  Hormone treatment or birth control pills. These methods are sometimes used to help dissolve a cyst. HOME CARE INSTRUCTIONS   Only  take over-the-counter or prescription medicines as directed by your health care provider.  Follow up with your health care provider as directed.  Get regular pelvic exams and Pap tests. SEEK MEDICAL CARE IF:   Your periods are late, irregular, or painful, or they stop.  Your pelvic pain or abdominal pain does not go away.  Your abdomen becomes larger or swollen.  You have pressure on your bladder or trouble emptying your bladder completely.  You have pain during sexual intercourse.  You have feelings of fullness, pressure, or discomfort in your stomach.  You lose weight for no apparent reason.  You feel generally ill.  You become constipated.  You lose your appetite.  You develop acne.  You have an increase in body and facial hair.  You are gaining weight, without changing your exercise and eating habits.  You think you are pregnant. SEEK IMMEDIATE MEDICAL CARE IF:   You have increasing abdominal pain.  You feel sick to your stomach (nauseous), and you throw up (vomit).  You develop a fever that comes on suddenly.  You have abdominal pain during a bowel movement.  Your menstrual periods become heavier than usual. MAKE SURE YOU:  Understand these instructions.  Will watch your condition.  Will get help right away if you are not doing well or get worse.   This information is not intended to replace advice  given to you by your health care provider. Make sure you discuss any questions you have with your health care provider.   Document Released: 02/23/2005 Document Revised: 02/28/2013 Document Reviewed: 10/31/2012 Elsevier Interactive Patient Education [employer].

## 2015-03-27 NOTE — Progress Notes (Signed)
[name] [name] May 15, 1965 [id number]  History:    Presents for annual exam.  Cycles every 3-4 months/BTL. [name] [name] 26  04/2014. History of a 5 cm fibroid, persistent right ovarian 7 cm cyst has follow-up scheduled with Dr. Phineas Real February 2017. Normal Pap and mammogram history. Hypertension and hypothyroidism managed by primary care. History of GDM. Had a tummy tuck 01/2015.  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  25th anniversary trip planned in [place]. Parents hypertension, mother diabetes.  ROS:  A ROS was performed and pertinent positives and negatives are included.  Exam:  Filed Vitals:   03/27/15 0830  BP: 126/80    General appearance:  Normal Thyroid:  Symmetrical, normal in size, without palpable masses or nodularity. Respiratory  Auscultation:  Clear without wheezing or rhonchi Cardiovascular  Auscultation:  Regular rate, without rubs, murmurs or gallops  Edema/varicosities:  Not grossly evident Abdominal  Soft,nontender, without masses, guarding or rebound.  Liver/spleen:  No organomegaly noted  Hernia:  None appreciated  Skin  Inspection:  Grossly normal   Breasts: Examined lying and sitting.     Right: Without masses, retractions, discharge or axillary adenopathy.     Left: Without masses, retractions, discharge or axillary adenopathy. Gentitourinary   Inguinal/mons:  Normal without inguinal adenopathy  External genitalia:  Normal  BUS/Urethra/Skene's glands:  Normal  Vagina:  Normal  Cervix:  Normal  Uterus: Enlarged, limited exam continues to be tender from tummy tuck  Midline and mobile  Adnexa/parametria:     Rt: Without masses or tenderness.   Lt: Without masses or tenderness.  Anus and perineum: Normal  Digital rectal exam: Normal sphincter tone without palpated masses or  tenderness  Assessment/Plan:  50 y.o.  MBF G3 P2 for annual ewith no complaints.  Perimenopausal cycles every 3-4 months/BTL Hypertension/hyperthyroidism-primary care manages labs and meds Persistent right ovarian 7 cm cyst has follow-up scheduled February 2017  Plan: FSH, UA, Pap. SBE's, continue annual 3-D screening mammogram. Keep scheduled appointment with Dr. Phineas Real. Encouraged regular exercise, calcium rich diet, vitamin D 1000 daily. Reviewed importance of weightbearing exercise.   [name] [facility], 10:53 AM 03/27/2015

## 2015-03-28 LAB — URINALYSIS W MICROSCOPIC + REFLEX CULTURE
BILIRUBIN URINE: NEGATIVE
Bacteria, UA: NONE SEEN [HPF]
CRYSTALS: NONE SEEN [HPF]
Casts: NONE SEEN [LPF]
GLUCOSE, UA: NEGATIVE
HGB URINE DIPSTICK: NEGATIVE
KETONES UR: NEGATIVE
LEUKOCYTES UA: NEGATIVE
Nitrite: NEGATIVE
PROTEIN: NEGATIVE
RBC / HPF: NONE SEEN RBC/HPF (ref <=2)
SQUAMOUS EPITHELIAL / LPF: NONE SEEN [HPF] (ref <=5)
Specific Gravity, Urine: 1.007 (ref 1.001–1.035)
WBC UA: NONE SEEN WBC/HPF (ref <=5)
Yeast: NONE SEEN [HPF]
pH: 5.5 (ref 5.0–8.0)

## 2015-03-28 LAB — FOLLICLE STIMULATING HORMONE: FSH: 46.9 m[IU]/mL

## 2015-04-01 LAB — CYTOLOGY - PAP

## 2015-04-22 ENCOUNTER — Ambulatory Visit: Payer: BLUE CROSS/BLUE SHIELD | Admitting: Gynecology

## 2015-04-22 ENCOUNTER — Other Ambulatory Visit: Payer: BLUE CROSS/BLUE SHIELD

## 2015-04-26 ENCOUNTER — Ambulatory Visit
Admission: RE | Admit: 2015-04-26 | Discharge: 2015-04-26 | Disposition: A | Discharge status: Home / Self Care | Payer: BLUE CROSS/BLUE SHIELD | Source: Ambulatory Visit

## 2015-04-26 DIAGNOSIS — Z1231 Encounter for screening mammogram for malignant neoplasm of breast: Secondary | ICD-10-CM

## 2015-04-30 ENCOUNTER — Other Ambulatory Visit: Payer: Self-pay | Admitting: Family Medicine

## 2015-04-30 DIAGNOSIS — R928 Other abnormal and inconclusive findings on diagnostic imaging of breast: Secondary | ICD-10-CM

## 2015-05-01 ENCOUNTER — Other Ambulatory Visit: Payer: BLUE CROSS/BLUE SHIELD

## 2015-05-01 ENCOUNTER — Ambulatory Visit: Payer: BLUE CROSS/BLUE SHIELD | Admitting: Gynecology

## 2015-05-03 ENCOUNTER — Ambulatory Visit
Admission: RE | Admit: 2015-05-03 | Discharge: 2015-05-03 | Disposition: A | Discharge status: Home / Self Care | Payer: BLUE CROSS/BLUE SHIELD | Source: Ambulatory Visit | Attending: Family Medicine | Admitting: Family Medicine

## 2015-05-03 ENCOUNTER — Other Ambulatory Visit: Payer: BLUE CROSS/BLUE SHIELD

## 2015-05-03 DIAGNOSIS — R928 Other abnormal and inconclusive findings on diagnostic imaging of breast: Secondary | ICD-10-CM

## 2015-10-29 ENCOUNTER — Other Ambulatory Visit: Payer: Self-pay | Admitting: Family Medicine

## 2015-10-29 DIAGNOSIS — N63 Unspecified lump in unspecified breast: Secondary | ICD-10-CM

## 2015-11-20 ENCOUNTER — Ambulatory Visit
Admission: RE | Admit: 2015-11-20 | Discharge: 2015-11-20 | Disposition: A | Discharge status: Home / Self Care | Payer: BLUE CROSS/BLUE SHIELD | Source: Ambulatory Visit | Attending: Family Medicine | Admitting: Family Medicine

## 2015-11-20 DIAGNOSIS — N63 Unspecified lump in unspecified breast: Secondary | ICD-10-CM

## 2016-03-27 ENCOUNTER — Encounter: Payer: Self-pay | Admitting: Women's Health

## 2016-03-27 ENCOUNTER — Ambulatory Visit (INDEPENDENT_AMBULATORY_CARE_PROVIDER_SITE_OTHER): Payer: 59 | Admitting: Women's Health

## 2016-03-27 VITALS — BP 130/86 | Ht 64.0 in | Wt 197.8 lb

## 2016-03-27 DIAGNOSIS — D259 Leiomyoma of uterus, unspecified: Secondary | ICD-10-CM | POA: Insufficient documentation

## 2016-03-27 DIAGNOSIS — Z1151 Encounter for screening for human papillomavirus (HPV): Secondary | ICD-10-CM | POA: Diagnosis not present

## 2016-03-27 DIAGNOSIS — Z01419 Encounter for gynecological examination (general) (routine) without abnormal findings: Secondary | ICD-10-CM

## 2016-03-27 DIAGNOSIS — N83201 Unspecified ovarian cyst, right side: Secondary | ICD-10-CM

## 2016-03-27 DIAGNOSIS — N83209 Unspecified ovarian cyst, unspecified side: Secondary | ICD-10-CM | POA: Insufficient documentation

## 2016-03-27 LAB — URINALYSIS W MICROSCOPIC + REFLEX CULTURE
BILIRUBIN URINE: NEGATIVE
Bacteria, UA: NONE SEEN [HPF]
CASTS: NONE SEEN [LPF]
CRYSTALS: NONE SEEN [HPF]
Glucose, UA: NEGATIVE
Hgb urine dipstick: NEGATIVE
Ketones, ur: NEGATIVE
Leukocytes, UA: NEGATIVE
Nitrite: NEGATIVE
Protein, ur: NEGATIVE
SPECIFIC GRAVITY, URINE: 1.023 (ref 1.001–1.035)
Yeast: NONE SEEN [HPF]
pH: 5.5 (ref 5.0–8.0)

## 2016-03-27 NOTE — Patient Instructions (Signed)
Colonoscopy  Dr Carlean Purl at [facility] Postmenopausal Women Introduction Menopause is a normal process in which your reproductive ability comes to an end. This process happens gradually over a span of months to years, usually between the ages of 29 and 79. Menopause is complete when you have missed 12 consecutive menstrual periods. It is important to talk with your health care provider about some of the most common conditions that affect postmenopausal women, such as heart disease, cancer, and bone loss (osteoporosis). Adopting a healthy lifestyle and getting preventive care can help to promote your health and wellness. Those actions can also lower your chances of developing some of these common conditions. What should I know about menopause? During menopause, you may experience a number of symptoms, such as:  Moderate-to-severe hot flashes.  Night sweats.  Decrease in sex drive.  Mood swings.  Headaches.  Tiredness.  Irritability.  Memory problems.  Insomnia. Choosing to treat or not to treat menopausal changes is an individual decision that you make with your health care provider. What should I know about hormone replacement therapy and supplements? Hormone therapy products are effective for treating symptoms that are associated with menopause, such as hot flashes and night sweats. Hormone replacement carries certain risks, especially as you become older. If you are thinking about using estrogen or estrogen with progestin treatments, discuss the benefits and risks with your health care provider. What should I know about heart disease and stroke? Heart disease, heart attack, and stroke become more likely as you age. This may be due, in part, to the hormonal changes that your body experiences during menopause. These can affect how your body processes dietary fats, triglycerides, and cholesterol. Heart attack and stroke are both medical emergencies. There  are many things that you can do to help prevent heart disease and stroke:  Have your blood pressure checked at least every 1-2 years. High blood pressure causes heart disease and increases the risk of stroke.  If you are 53-21 years old, ask your health care provider if you should take aspirin to prevent a heart attack or a stroke.  Do not use any tobacco products, including cigarettes, chewing tobacco, or electronic cigarettes. If you need help quitting, ask your health care provider.  It is important to eat a healthy diet and maintain a healthy weight.  Be sure to include plenty of vegetables, fruits, low-fat dairy products, and lean protein.  Avoid eating foods that are high in solid fats, added sugars, or salt (sodium).  Get regular exercise. This is one of the most important things that you can do for your health.  Try to exercise for at least 150 minutes each week. The type of exercise that you do should increase your heart rate and make you sweat. This is known as moderate-intensity exercise.  Try to do strengthening exercises at least twice each week. Do these in addition to the moderate-intensity exercise.  Know your numbers.Ask your health care provider to check your cholesterol and your blood glucose. Continue to have your blood tested as directed by your health care provider. What should I know about cancer screening? There are several types of cancer. Take the following steps to reduce your risk and to catch any cancer development as early as possible. Breast Cancer  Practice breast self-awareness.  This means understanding how your breasts normally appear and feel.  It also means doing regular breast self-exams. Let your health care provider know about any changes, no  matter how small.  If you are 61 or older, have a clinician do a breast exam (clinical breast exam or CBE) every year. Depending on your age, family history, and medical history, it may be recommended that  you also have a yearly breast X-ray (mammogram).  If you have a family history of breast cancer, talk with your health care provider about genetic screening.  If you are at high risk for breast cancer, talk with your health care provider about having an MRI and a mammogram every year.  Breast cancer (BRCA) gene test is recommended for women who have family members with BRCA-related cancers. Results of the assessment will determine the need for genetic counseling and BRCA1 and for BRCA2 testing. BRCA-related cancers include these types:  Breast. This occurs in males or females.  Ovarian.  Tubal. This may also be called fallopian tube cancer.  Cancer of the abdominal or pelvic lining (peritoneal cancer).  Prostate.  Pancreatic. Cervical, Uterine, and Ovarian Cancer  Your health care provider may recommend that you be screened regularly for cancer of the pelvic organs. These include your ovaries, uterus, and vagina. This screening involves a pelvic exam, which includes checking for microscopic changes to the surface of your cervix (Pap test).  For women ages 21-65, health care providers may recommend a pelvic exam and a Pap test every three years. For women ages 48-65, they may recommend the Pap test and pelvic exam, combined with testing for human papilloma virus (HPV), every five years. Some types of HPV increase your risk of cervical cancer. Testing for HPV may also be done on women of any age who have unclear Pap test results.  Other health care providers may not recommend any screening for nonpregnant women who are considered low risk for pelvic cancer and have no symptoms. Ask your health care provider if a screening pelvic exam is right for you.  If you have had past treatment for cervical cancer or a condition that could lead to cancer, you need Pap tests and screening for cancer for at least 20 years after your treatment. If Pap tests have been discontinued for you, your risk factors  (such as having a new sexual partner) need to be reassessed to determine if you should start having screenings again. Some women have medical problems that increase the chance of getting cervical cancer. In these cases, your health care provider may recommend that you have screening and Pap tests more often.  If you have a family history of uterine cancer or ovarian cancer, talk with your health care provider about genetic screening.  If you have vaginal bleeding after reaching menopause, tell your health care provider.  There are currently no reliable tests available to screen for ovarian cancer. Lung Cancer  Lung cancer screening is recommended for adults 38-59 years old who are at high risk for lung cancer because of a history of smoking. A yearly low-dose CT scan of the lungs is recommended if you:  Currently smoke.  Have a history of at least 30 pack-years of smoking and you currently smoke or have quit within the past 15 years. A pack-year is smoking an average of one pack of cigarettes per day for one year. Yearly screening should:  Continue until it has been 15 years since you quit.  Stop if you develop a health problem that would prevent you from having lung cancer treatment. Colorectal Cancer  This type of cancer can be detected and can often be prevented.  Routine  colorectal cancer screening usually begins at age 85 and continues through age 68.  If you have risk factors for colon cancer, your health care provider may recommend that you be screened at an earlier age.  If you have a family history of colorectal cancer, talk with your health care provider about genetic screening.  Your health care provider may also recommend using home test kits to check for hidden blood in your stool.  A small camera at the end of a tube can be used to examine your colon directly (sigmoidoscopy or colonoscopy). This is done to check for the earliest forms of colorectal cancer.  Direct  examination of the colon should be repeated every 5-10 years until age 75. However, if early forms of precancerous polyps or small growths are found or if you have a family history or genetic risk for colorectal cancer, you may need to be screened more often. Skin Cancer  Check your skin from head to toe regularly.  Monitor any moles. Be sure to tell your health care provider:  About any new moles or changes in moles, especially if there is a change in a mole's shape or color.  If you have a mole that is larger than the size of a pencil eraser.  If any of your family members has a history of skin cancer, especially at a Lorin Hauck age, talk with your health care provider about genetic screening.  Always use sunscreen. Apply sunscreen liberally and repeatedly throughout the day.  Whenever you are outside, protect yourself by wearing long sleeves, pants, a wide-brimmed hat, and sunglasses. What should I know about osteoporosis? Osteoporosis is a condition in which bone destruction happens more quickly than new bone creation. After menopause, you may be at an increased risk for osteoporosis. To help prevent osteoporosis or the bone fractures that can happen because of osteoporosis, the following is recommended:  If you are 88-21 years old, get at least 1,000 mg of calcium and at least 600 mg of vitamin D per day.  If you are older than age 24 but younger than age 38, get at least 1,200 mg of calcium and at least 600 mg of vitamin D per day.  If you are older than age 59, get at least 1,200 mg of calcium and at least 800 mg of vitamin D per day. Smoking and excessive alcohol intake increase the risk of osteoporosis. Eat foods that are rich in calcium and vitamin D, and do weight-bearing exercises several times each week as directed by your health care provider. What should I know about how menopause affects my mental health? Depression may occur at any age, but it is more common as you become older.  Common symptoms of depression include:  Low or sad mood.  Changes in sleep patterns.  Changes in appetite or eating patterns.  Feeling an overall lack of motivation or enjoyment of activities that you previously enjoyed.  Frequent crying spells. Talk with your health care provider if you think that you are experiencing depression. What should I know about immunizations? It is important that you get and maintain your immunizations. These include:  Tetanus, diphtheria, and pertussis (Tdap) booster vaccine.  Influenza every year before the flu season begins.  Pneumonia vaccine.  Shingles vaccine. Your health care provider may also recommend other immunizations. This information is not intended to replace advice given to you by your health care provider. Make sure you discuss any questions you have with your health care provider. Document Released:  Document Revised: 09/13/2015 Document Reviewed: 11/27/2014  2017 Elsevier  

## 2016-03-27 NOTE — Addendum Note (Signed)
Addended by: Thurnell Garbe A on: 03/27/2016 09:48 AM   Modules accepted: Orders

## 2016-03-27 NOTE — Progress Notes (Signed)
[name] [name] 06-Aug-1965 [id number]    History:    Presents for annual exam.  One short/light this past year, [facility] 48 03/2015. History of a 5 cm fibroid/ asymptomatic. Persistent 6 cm right ovarian cyst with negative Ca-125. Abnormal  Pap 2010 negative colposcopy and biopsy, normal Paps after. Mammogram normal after ultrasound has six-month follow-up on left breast. Hypertension and hypothyroidism managed by primary care. History of GDM.  Past medical history, past surgical history, family history and social history were all reviewed and documented in the EPIC chart. Physical therapist, finishing up PHD. Parents hypertension, mother  diabetes. Son senior in college, daughter 57 both doing well has had gardasil.  ROS:  A ROS was performed and pertinent positives and negatives are included.  Exam:  Vitals:   03/27/16 0840  BP: 130/86  Weight: 197 lb 12.8 oz (89.7 kg)  Height: 5\' 4"  (1.626 m)   Body mass index is 33.95 kg/m.   General appearance:  Normal Thyroid:  Symmetrical, normal in size, without palpable masses or nodularity. Respiratory  Auscultation:  Clear without wheezing or rhonchi Cardiovascular  Auscultation:  Regular rate, without rubs, murmurs or gallops  Edema/varicosities:  Not grossly evident Abdominal  Soft,nontender, without masses, guarding or rebound.  Liver/spleen:  No organomegaly noted  Hernia:  None appreciated  Skin  Inspection:  Grossly normal   Breasts: Examined lying and sitting.     Right: Without masses, retractions, discharge or axillary adenopathy.     Left: Without masses, retractions, discharge or axillary adenopathy. Gentitourinary   Inguinal/mons:  Normal without inguinal adenopathy  External genitalia:  Normal  BUS/Urethra/Skene's glands:  Normal  Vagina:  Normal  Cervix:  Normal  Uterus:  Enlarged/fibroid normal in size, shape and contour.  Midline and mobile  Adnexa/parametria:     Rt: Without masses or  tenderness.   Lt: Without masses or tenderness.  Anus and perineum: Normal  Digital rectal exam: Normal sphincter tone without palpated masses or tenderness  Assessment/Plan:  51 y.o. MBF G3 P2  for annual exam with no complaints.  Postmenopausal/no HRT/no bleeding > 8 mo Fibroid uterus/asymptomatic Persistent right ovarian cyst Hypertension/hyperthyroidism-primary care manages labs and meds Obesity  Plan: Ultrasound, instructed to schedule. Instructed to call if any further bleeding. Minimal menopausal symptoms - tolerating. SBE's, continue annual 3-D screening mammogram and follow-up left breast diagnostic mammogram in 6 months. Exercise, calcium rich diet, increasing leisure activities encouraged. UA, Pap with HR HPV typing, new screening guidelines reviewed.    [name] [facility], 8:47 AM 03/27/2016

## 2016-03-28 LAB — URINE CULTURE: ORGANISM ID, BACTERIA: NO GROWTH

## 2016-04-01 LAB — PAP, TP IMAGING W/ HPV RNA, RFLX HPV TYPE 16,18/45: HPV mRNA, High Risk: NOT DETECTED

## 2016-04-15 ENCOUNTER — Ambulatory Visit: Payer: 59 | Admitting: Women's Health

## 2016-04-15 ENCOUNTER — Other Ambulatory Visit: Payer: 59

## 2016-04-22 ENCOUNTER — Other Ambulatory Visit: Payer: Self-pay | Admitting: Women's Health

## 2016-04-22 DIAGNOSIS — N6320 Unspecified lump in the left breast, unspecified quadrant: Secondary | ICD-10-CM

## 2016-05-18 ENCOUNTER — Encounter: Payer: Self-pay | Admitting: Women's Health

## 2016-05-18 ENCOUNTER — Ambulatory Visit
Admission: RE | Admit: 2016-05-18 | Discharge: 2016-05-18 | Disposition: A | Discharge status: Home / Self Care | Payer: 59 | Source: Ambulatory Visit | Attending: Women's Health | Admitting: Women's Health

## 2016-05-18 DIAGNOSIS — N6320 Unspecified lump in the left breast, unspecified quadrant: Secondary | ICD-10-CM

## 2016-05-18 DIAGNOSIS — R928 Other abnormal and inconclusive findings on diagnostic imaging of breast: Secondary | ICD-10-CM | POA: Diagnosis not present

## 2016-05-27 ENCOUNTER — Encounter: Payer: Self-pay | Admitting: Women's Health

## 2016-05-27 ENCOUNTER — Ambulatory Visit (INDEPENDENT_AMBULATORY_CARE_PROVIDER_SITE_OTHER): Payer: 59 | Admitting: Women's Health

## 2016-05-27 ENCOUNTER — Ambulatory Visit (INDEPENDENT_AMBULATORY_CARE_PROVIDER_SITE_OTHER): Payer: 59

## 2016-05-27 ENCOUNTER — Other Ambulatory Visit: Payer: Self-pay | Admitting: Women's Health

## 2016-05-27 VITALS — BP 138/90

## 2016-05-27 DIAGNOSIS — N839 Noninflammatory disorder of ovary, fallopian tube and broad ligament, unspecified: Secondary | ICD-10-CM

## 2016-05-27 DIAGNOSIS — N83201 Unspecified ovarian cyst, right side: Secondary | ICD-10-CM | POA: Diagnosis not present

## 2016-05-27 DIAGNOSIS — N838 Other noninflammatory disorders of ovary, fallopian tube and broad ligament: Secondary | ICD-10-CM

## 2016-05-27 NOTE — Patient Instructions (Signed)
Ovarian Cystectomy, Care After Refer to this sheet in the next few weeks. These instructions provide you with information on caring for yourself after your procedure. Your health care provider may also give you more specific instructions. Your treatment has been planned according to current medical practices, but problems sometimes occur. Call your health care provider if you have any problems or questions after your procedure. What can I expect after the procedure? After your procedure, it is typical to have the following:  Pain in your abdomen, especially at the incision sites. You will be given pain medicines to control the pain.  Tiredness. This is a normal part of the recovery process. Your energy level will return to normal over the next several weeks.  Constipation. Follow these instructions at home:  Only take over-the-counter or prescription medicines as directed by your health care provider. Avoid taking aspirin because it can cause bleeding.  Follow your health care provider's instructions for when to resume your regular diet, exercise, and activities.  Take rest breaks during the day as needed.  Do not douche or have sexual intercourse until you have permission from your health care provider.  Remove or change any bandages (dressings) as directed by your health care provider.  Do not drive until your health care provider approves.  Take showers instead of baths until your health care provider tells you otherwise.  If you become constipated, you may:  Use a mild laxative if your health care provider approves.  Add more fruit and bran to your diet.  Drink more fluids.  Take your temperature twice a day and record it.  Do not drink alcohol while taking pain medicine.  Try to have someone home with you for the first 1-2 weeks to help with your household activities.  Follow up with your health care provider as directed. Contact a health care provider if:  You have a  fever.  You feel sick to your stomach (nauseous) and throw up (vomit).  You have redness, swelling, or leakage of fluid at the incision site.  You have pain when you urinate or have blood in your urine.  You have a rash on your body.  You have pain or redness where the IV tube was inserted.  You have pain that is not relieved with medicine. Get help right away if:  You have chest pain or shortness of breath.  You feel dizzy or lightheaded.  You have increasing abdominal pain that is not relieved with medicines.  You have pain, swelling, or redness in your leg.  You see a yellowish white fluid (pus) coming from the incision.  Your incision is opening (edges not staying together). This information is not intended to replace advice given to you by your health care provider. Make sure you discuss any questions you have with your health care provider. Document Released: 12/14/2012 Document Revised: 08/01/2015 Document Reviewed: 10/05/2012 Elsevier Interactive Patient Education  2017 [employer]. Ovarian Cyst  An ovarian cyst is a fluid-filled sac that forms on an ovary. The ovaries are small organs that produce eggs in women. Various types of cysts can form on the ovaries. Some may cause symptoms and require treatment. Most ovarian cysts go away on their own, are not cancerous (are benign), and do not cause problems. Common types of ovarian cysts include:  Functional (follicle) cysts.  Occur during the menstrual cycle, and usually go away with the next menstrual cycle if you do not get pregnant.  Usually cause no symptoms.  Endometriomas.  Are cysts that form from the tissue that lines the uterus (endometrium).  Are sometimes called "chocolate cysts" because they become filled with blood that turns brown.  Can cause pain in the lower abdomen during intercourse and during your period.  Cystadenoma cysts.  Develop from cells on the outside surface of the ovary.  Can get  very large and cause lower abdomen pain and pain with intercourse.  Can cause severe pain if they twist or break open (rupture).  Dermoid cysts.  Are sometimes found in both ovaries.  May contain different kinds of body tissue, such as skin, teeth, hair, or cartilage.  Usually do not cause symptoms unless they get very big.  Theca lutein cysts.  Occur when too much of a certain hormone (human chorionic gonadotropin) is produced and overstimulates the ovaries to produce an egg.  Are most common after having procedures used to assist with the conception of a baby (in vitro fertilization). What are the causes? Ovarian cysts may be caused by:  Ovarian hyperstimulation syndrome. This is a condition that can develop from taking fertility medicines. It causes multiple large ovarian cysts to form.  Polycystic ovarian syndrome (PCOS). This is a common hormonal disorder that can cause ovarian cysts, as well as problems with your period or fertility. What increases the risk? The following factors may make you more likely to develop ovarian cysts:  Being overweight or obese.  Taking fertility medicines.  Taking certain forms of hormonal birth control.  Smoking. What are the signs or symptoms? Many ovarian cysts do not cause symptoms. If symptoms are present, they may include:  Pelvic pain or pressure.  Pain in the lower abdomen.  Pain during sex.  Abdominal swelling.  Abnormal menstrual periods.  Increasing pain with menstrual periods. How is this diagnosed? These cysts are commonly found during a routine pelvic exam. You may have tests to find out more about the cyst, such as:  Ultrasound.  X-ray of the pelvis.  CT scan.  MRI.  Blood tests. How is this treated? Many ovarian cysts go away on their own without treatment. Your health care provider may want to check your cyst regularly for 2-3 months to see if it changes. If you are in menopause, it is especially  important to have your cyst monitored closely because menopausal women have a higher rate of ovarian cancer. When treatment is needed, it may include:  Medicines to help relieve pain.  A procedure to drain the cyst (aspiration).  Surgery to remove the whole cyst.  Hormone treatment or birth control pills. These methods are sometimes used to help dissolve a cyst. Follow these instructions at home:  Take over-the-counter and prescription medicines only as told by your health care provider.  Do not drive or use heavy machinery while taking prescription pain medicine.  Get regular pelvic exams and Pap tests as often as told by your health care provider.  Return to your normal activities as told by your health care provider. Ask your health care provider what activities are safe for you.  Do not use any products that contain nicotine or tobacco, such as cigarettes and e-cigarettes. If you need help quitting, ask your health care provider.  Keep all follow-up visits as told by your health care provider. This is important. Contact a health care provider if:  Your periods are late, irregular, or painful, or they stop.  You have pelvic pain that does not go away.  You have pressure on your bladder  or trouble emptying your bladder completely.  You have pain during sex.  You have any of the following in your abdomen:  A feeling of fullness.  Pressure.  Discomfort.  Pain that does not go away.  Swelling.  You feel generally ill.  You become constipated.  You lose your appetite.  You develop severe acne.  You start to have more body hair and facial hair.  You are gaining weight or losing weight without changing your exercise and eating habits.  You think you may be pregnant. Get help right away if:  You have abdominal pain that is severe or gets worse.  You cannot eat or drink without vomiting.  You suddenly develop a fever.  Your menstrual period is much heavier  than usual. This information is not intended to replace advice given to you by your health care provider. Make sure you discuss any questions you have with your health care provider. Document Released: 02/23/2005 Document Revised: 09/13/2015 Document Reviewed: 07/28/2015 Elsevier Interactive Patient Education  2017 [employer].

## 2016-05-27 NOTE — Progress Notes (Signed)
Presents for ultrasound. 10/2014 several fibroids and 61 mm mean (53 x 29 x 26 mm) right ovarian cyst CA 125  10.  Cyst had minimal change from 04/2014 - 10/2014. Had contemplated having right cystectomy was having no symptoms or problems decided to not proceed. Currently having no abdominal pain, symptoms, pressure, or bleeding. Elevated Candor 03/2015, amenorrheic 9 months with minimal menopausal symptoms that are not bothersome. Hypertension managed by primary care . Physical therapist.  Exam: Appears well. Ultrasound: T/V and T/A anteverted uterus intramural and subserous fibroids 53 x 40 x 49 mm, 322434 mm, 21 x 14 mm, 17 x 24 mm, right adnexal ovarian mass 83 x 61 x 54 mm. 66 mm mean,  positive arterial blood flow to wall of mass, cystic solid lobulated solid area. Left ovary normal. Negative cul-de-sac. Endometrium 2.2 mm.  Right ovarian mass 66 mm mean/slight enlargement from 2016 Fibroid uterus  Plan: CA-125. We'll triage based on results, reviewed probable need for surgical removal, will schedule surgical consult with Dr. Phineas Real pending CA-125 results.

## 2016-05-28 ENCOUNTER — Encounter: Payer: Self-pay | Admitting: Women's Health

## 2016-05-28 LAB — CA 125: CA 125: 4 U/mL (ref <35)

## 2016-07-09 ENCOUNTER — Institutional Professional Consult (permissible substitution): Payer: 59 | Admitting: Gynecology

## 2016-07-16 ENCOUNTER — Institutional Professional Consult (permissible substitution): Payer: 59 | Admitting: Gynecology

## 2016-07-16 DIAGNOSIS — Z0289 Encounter for other administrative examinations: Secondary | ICD-10-CM

## 2016-08-04 ENCOUNTER — Ambulatory Visit (INDEPENDENT_AMBULATORY_CARE_PROVIDER_SITE_OTHER): Payer: 59 | Admitting: Gynecology

## 2016-08-04 ENCOUNTER — Encounter: Payer: Self-pay | Admitting: Gynecology

## 2016-08-04 VITALS — BP 130/78

## 2016-08-04 DIAGNOSIS — D259 Leiomyoma of uterus, unspecified: Secondary | ICD-10-CM

## 2016-08-04 DIAGNOSIS — N83201 Unspecified ovarian cyst, right side: Secondary | ICD-10-CM | POA: Diagnosis not present

## 2016-08-04 NOTE — Patient Instructions (Signed)
Follow up this coming fall for ultrasound

## 2016-08-04 NOTE — Progress Notes (Signed)
    [name] [name] [birth year] [id number]        51 y.o.  [id number] presents to discuss treatment options for her leiomyoma and her persistent right ovarian cystic solid vascular mass thought to be a dermoid. Initial ultrasound 11/2013 showed the largest myoma measuring 53 mm and the right ovarian cystic mass measuring 82 mm mean. CA-125 was 10. Initially plan on expectant management with follow up ultrasound 04/2014 showed largest leiomyoma measuring 50 mm and the right ovarian cystic mass 68 mm mean. Follow up ultrasound 10/2014 showed largest leiomyoma 53 mm and right cystic mass 61 mm mean. Most recent ultrasound 05/2016 showed  largest myoma 53 mm and the cystic area measuring 66 mm. CA-125 was 4.  Patient is having no pain either spontaneously or with intercourse. Most recent [facility] 46 January 2017 and she no longer is menstruating.  Past medical history,surgical history, problem list, medications, allergies, family history and social history were all reviewed and documented in the EPIC chart.  Directed ROS with pertinent positives and negatives documented in the history of present illness/assessment and plan.  Exam: [name] [name] assistant Vitals:   08/04/16 1604  BP: 130/78   General appearance:  Normal Abdomen soft nontender without masses guarding rebound Pelvic external BUS vagina normal. Cervix normal. Uterus bulky without separate palpable masses. Adnexa without gross separate masses.   Assessment/Plan:  51 y.o. G3P001 with persistent leiomyoma without significant change. No longer menstruating with no pain. From a leiomyoma standpoint discussed with patient at this point do not see need that we would have to proceed with intervention such as hysterectomy given her picture. Second issue is her persistent right cystic solid vascular mass present in excess of 2 1/2 years without significant change since initially noted 11/2013. Recent CA-125 was 4. Disclaimer that we cannot guarantee  pathology and although we suspect this to be a dermoid low-level cancer cannot be excluded. Options for management reviewed to include continued observation versus surgical intervention to include laparoscopic salpingo-oophorectomy/bilateral salpingo-oophorectomy with and without hysterectomy. After a lengthy discussion the patient is not interested in surgery accepting the disclaimer cannot rule out cancer and will present this coming fall for follow up ultrasound.   Greater than 50% of my time was spent in direct face to face counseling and coordination of care with the patient.     [name] MD, 4:40 PM 08/04/2016

## 2016-08-05 ENCOUNTER — Other Ambulatory Visit: Payer: Self-pay | Admitting: *Deleted

## 2016-08-05 DIAGNOSIS — D259 Leiomyoma of uterus, unspecified: Secondary | ICD-10-CM

## 2016-10-23 ENCOUNTER — Telehealth: Payer: Self-pay | Admitting: *Deleted

## 2016-10-23 NOTE — Telephone Encounter (Signed)
Pt called c/o light cycle, appears per note pt LMP: 01/27/2015 pt said she had bloating and breast tenderness, I called pt back and received her voicemail. I left on VM schedule OV with nancy. And also left my number for pt to call.

## 2016-11-03 ENCOUNTER — Ambulatory Visit (INDEPENDENT_AMBULATORY_CARE_PROVIDER_SITE_OTHER): Payer: 59 | Admitting: Women's Health

## 2016-11-03 ENCOUNTER — Encounter: Payer: Self-pay | Admitting: Women's Health

## 2016-11-03 VITALS — BP 142/80

## 2016-11-03 DIAGNOSIS — B9689 Other specified bacterial agents as the cause of diseases classified elsewhere: Secondary | ICD-10-CM | POA: Diagnosis not present

## 2016-11-03 DIAGNOSIS — N760 Acute vaginitis: Secondary | ICD-10-CM | POA: Diagnosis not present

## 2016-11-03 LAB — WET PREP FOR TRICH, YEAST, CLUE
Trich, Wet Prep: NONE SEEN
WBC, Wet Prep HPF POC: NONE SEEN
YEAST WET PREP: NONE SEEN

## 2016-11-03 MED ORDER — METRONIDAZOLE 500 MG PO TABS: 500.0000 mg | ORAL_TABLET | Freq: Two times a day (BID) | ORAL | 0 refills | Status: DC

## 2016-11-03 NOTE — Progress Notes (Signed)
Presents with complaint of one week of light spotting, vaginal discharge with odor noted after cycle, and states is ready to schedule hysterectomy with Dr. Phineas Real. 08/04/2016 with Dr. Phineas Real to discuss hysterectomy. History of a 5 cm fibroid, persistent right ovarian 6 cm cyst cm with low CA-125. States has had some bloating with occasional pressure symptoms recently. Had one cycle last year,  2 weeks ago had another cycle. Does have an elevated FSH. 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 Medical problems include hypertension and hypothyroidism. Under increased stress, mother recently had a stroke who lives in [place] and is moving her to her home.  Exam: Appears well. External genitalia within normal limits, speculum exam moderate amount of thin white adherent discharge with odor noted, wet prep positive for many clues, TNTC bacteria. Bimanual uterus enlarged nontender, reports  pressure sensation.  Bacteria vaginosis Fibroid uterus with persistent right ovarian cyst  Plan: Flagyl 500 twice daily for 7 days, alcohol precautions reviewed. Instructed to call if no relief of symptoms. Will schedule for TAH with BSO with Dr. Phineas Real, follow-up appointment week prior to scheduled surgery. Would like to have in November. Reviewed blood pressure slightly elevated today.  Marland Kitchen

## 2016-11-03 NOTE — Patient Instructions (Signed)
Bacterial Vaginosis Bacterial vaginosis is a vaginal infection that occurs when the normal balance of bacteria in the vagina is disrupted. It results from an overgrowth of certain bacteria. This is the most common vaginal infection among women ages 15-44. Because bacterial vaginosis increases your risk for STIs (sexually transmitted infections), getting treated can help reduce your risk for chlamydia, gonorrhea, herpes, and HIV (human immunodeficiency virus). Treatment is also important for preventing complications in pregnant women, because this condition can cause an early (premature) delivery. What are the causes? This condition is caused by an increase in harmful bacteria that are normally present in small amounts in the vagina. However, the reason that the condition develops is not fully understood. What increases the risk? The following factors may make you more likely to develop this condition:  Having a new sexual partner or multiple sexual partners.  Having unprotected sex.  Douching.  Having an intrauterine device (IUD).  Smoking.  Drug and alcohol abuse.  Taking certain antibiotic medicines.  Being pregnant.  You cannot get bacterial vaginosis from toilet seats, bedding, swimming pools, or contact with objects around you. What are the signs or symptoms? Symptoms of this condition include:  Grey or white vaginal discharge. The discharge can also be watery or foamy.  A fish-like odor with discharge, especially after sexual intercourse or during menstruation.  Itching in and around the vagina.  Burning or pain with urination.  Some women with bacterial vaginosis have no signs or symptoms. How is this diagnosed? This condition is diagnosed based on:  Your medical history.  A physical exam of the vagina.  Testing a sample of vaginal fluid under a microscope to look for a large amount of bad bacteria or abnormal cells. Your health care provider may use a cotton swab  or a small wooden spatula to collect the sample.  How is this treated? This condition is treated with antibiotics. These may be given as a pill, a vaginal cream, or a medicine that is put into the vagina (suppository). If the condition comes back after treatment, a second round of antibiotics may be needed. Follow these instructions at home: Medicines  Take over-the-counter and prescription medicines only as told by your health care provider.  Take or use your antibiotic as told by your health care provider. Do not stop taking or using the antibiotic even if you start to feel better. General instructions  If you have a female sexual partner, tell her that you have a vaginal infection. She should see her health care provider and be treated if she has symptoms. If you have a female sexual partner, he does not need treatment.  During treatment: ? Avoid sexual activity until you finish treatment. ? Do not douche. ? Avoid alcohol as directed by your health care provider. ? Avoid breastfeeding as directed by your health care provider.  Drink enough water and fluids to keep your urine clear or pale yellow.  Keep the area around your vagina and rectum clean. ? Wash the area daily with warm water. ? Wipe yourself from front to back after using the toilet.  Keep all follow-up visits as told by your health care provider. This is important. How is this prevented?  Do not douche.  Wash the outside of your vagina with warm water only.  Use protection when having sex. This includes latex condoms and dental dams.  Limit how many sexual partners you have. To help prevent bacterial vaginosis, it is best to have sex with just   one partner (monogamous).  Make sure you and your sexual partner are tested for STIs.  Wear cotton or cotton-lined underwear.  Avoid wearing tight pants and pantyhose, especially during summer.  Limit the amount of alcohol that you drink.  Do not use any products that  contain nicotine or tobacco, such as cigarettes and e-cigarettes. If you need help quitting, ask your health care provider.  Do not use illegal drugs. Where to find more information:  Centers for Disease Control and Prevention: www.cdc.gov/std  American Sexual Health Association (ASHA): www.ashastd.org  U.S. Department of Health and Human Services, Office on Women's Health: www.womenshealth.gov/ or https://www.womenshealth.gov/a-z-topics/bacterial-vaginosis Contact a health care provider if:  Your symptoms do not improve, even after treatment.  You have more discharge or pain when urinating.  You have a fever.  You have pain in your abdomen.  You have pain during sex.  You have vaginal bleeding between periods. Summary  Bacterial vaginosis is a vaginal infection that occurs when the normal balance of bacteria in the vagina is disrupted.  Because bacterial vaginosis increases your risk for STIs (sexually transmitted infections), getting treated can help reduce your risk for chlamydia, gonorrhea, herpes, and HIV (human immunodeficiency virus). Treatment is also important for preventing complications in pregnant women, because the condition can cause an early (premature) delivery.  This condition is treated with antibiotic medicines. These may be given as a pill, a vaginal cream, or a medicine that is put into the vagina (suppository). This information is not intended to replace advice given to you by your health care provider. Make sure you discuss any questions you have with your health care provider. Document Released: 02/23/2005 Document Revised: 11/09/2015 Document Reviewed: 11/09/2015 Elsevier Interactive Patient Education  2017 Elsevier Inc.  

## 2016-11-05 ENCOUNTER — Telehealth: Payer: Self-pay

## 2016-11-05 NOTE — Telephone Encounter (Signed)
Office visit for exam to determine best route for hysterectomy

## 2016-11-05 NOTE — Telephone Encounter (Signed)
I called patient to let her know that [place] let me know she is ready to schedule Hysterectomy in Nov. She would like me to schedule it Nov 27.  I will hold time for her. She knows I am waiting to hear from Dr. Loetta Rough with surgery order. When I get that info I will check ins benefits and figure her surgery prepymt for her.

## 2016-11-05 NOTE — Telephone Encounter (Signed)
Dr. Loetta Rough- Patient wanted to let you know that she had "a tummy tuck" about a year ago.

## 2016-11-06 NOTE — Telephone Encounter (Signed)
Left detailed message for patient to call and schedule appt. With Dr. Loetta Rough.

## 2016-11-10 NOTE — Telephone Encounter (Signed)
Appt scheduled for 12/04/16.

## 2016-12-04 ENCOUNTER — Ambulatory Visit: Payer: 59 | Admitting: Gynecology

## 2016-12-08 DIAGNOSIS — L309 Dermatitis, unspecified: Secondary | ICD-10-CM | POA: Diagnosis not present

## 2016-12-08 DIAGNOSIS — E039 Hypothyroidism, unspecified: Secondary | ICD-10-CM | POA: Diagnosis not present

## 2016-12-08 DIAGNOSIS — Z13220 Encounter for screening for lipoid disorders: Secondary | ICD-10-CM | POA: Diagnosis not present

## 2016-12-08 DIAGNOSIS — I10 Essential (primary) hypertension: Secondary | ICD-10-CM | POA: Diagnosis not present

## 2016-12-08 DIAGNOSIS — Z0000 Encounter for general adult medical examination without abnormal findings: Secondary | ICD-10-CM | POA: Diagnosis not present

## 2016-12-09 ENCOUNTER — Encounter: Payer: Self-pay | Admitting: Gynecology

## 2016-12-09 ENCOUNTER — Ambulatory Visit (INDEPENDENT_AMBULATORY_CARE_PROVIDER_SITE_OTHER): Payer: 59 | Admitting: Gynecology

## 2016-12-09 VITALS — BP 124/78

## 2016-12-09 DIAGNOSIS — D259 Leiomyoma of uterus, unspecified: Secondary | ICD-10-CM

## 2016-12-09 DIAGNOSIS — D270 Benign neoplasm of right ovary: Secondary | ICD-10-CM

## 2016-12-09 NOTE — Patient Instructions (Signed)
Office will call you to arrange surgery. 

## 2016-12-09 NOTE — Progress Notes (Signed)
    [name] [name] [birth year] [id number]        51 y.o.  [id number] presents to discuss proceeding with hysterectomy. She has a long history of leiomyoma and persistent right ovarian cystic mass suspicious for dermoid. Last measurement 8 cm. Normal CA-125's. Having some pelvic pressure and discomfort. Is perimenopausal with elevated FSH and currently no menses. Not having significant hot flushes or sweats. Has been following this for a while but now is decided that she wants to proceed with hysterectomy and removal of the ovarian cyst.  Past medical history,surgical history, problem list, medications, allergies, family history and social history were all reviewed and documented in the EPIC chart.  Directed ROS with pertinent positives and negatives documented in the history of present illness/assessment and plan.  Exam: [name] [name] assistant Vitals:   12/09/16 1137  BP: 124/78   General appearance:  Normal Abdomen soft nontender without masses guarding rebound Pelvic external BUS vagina normal. Cervix normal. Little or no dissent. Uterus bulky midline mobile. Adnexa difficult to evaluate but no tenderness.  Assessment/Plan:  51 y.o. V6H2094 with persistent right ovarian mass consistent with dermoid and leiomyoma. No longer having menses in the perimenopausal time. Is having pelvic pressure and discomfort. Wants to proceed with hysterectomy and removal of the ovarian cyst. Patients uterus is well supported with little dissent. Has had 2 cesarean sections was no vaginal deliveries. I think it is most prudent to proceed with an abdominal approach. I did discuss and offer referral for consideration for robotic approach that may save a larger incision although will place multiple ports. Patient declines this stating she would prefer to proceed with the abdominal approach. The ovarian conservation issue was also discussed in the premenopausal timeframe given elevated FSH and the options to remove  both ovaries or salvage her left ovary for ongoing hormone production recognizing that it may not function much longer and accepting the long-term risks of ovarian disease to include cancer in the future was all reviewed with her. Options for HRT was also discussed if BSO and the risks versus benefits of this was reviewed to include the thrombosis issue and the breast cancer issue. At this point the patient would prefer to maintain her left ovary if possible accepting the risks of ovarian disease in the future but would give me permission to remove the ovary if it was my best intraoperative decision. We discussed in general was involved with the surgery to include the incision and recovery period. She has no aversions to transfusions if needed. We will go ahead move toward scheduling her surgery and she will follow up for a preoperative appointment.  Greater than 50% of my time was spent in direct face to face counseling and coordination of care with the patient.    [name] MD, 12:01 PM 12/09/2016

## 2017-01-12 ENCOUNTER — Encounter: Payer: Self-pay | Admitting: Gynecology

## 2017-01-21 DIAGNOSIS — Z1211 Encounter for screening for malignant neoplasm of colon: Secondary | ICD-10-CM | POA: Diagnosis not present

## 2017-01-25 ENCOUNTER — Ambulatory Visit (INDEPENDENT_AMBULATORY_CARE_PROVIDER_SITE_OTHER): Payer: 59 | Admitting: Gynecology

## 2017-01-25 ENCOUNTER — Encounter: Payer: Self-pay | Admitting: Gynecology

## 2017-01-25 VITALS — BP 126/80

## 2017-01-25 DIAGNOSIS — N839 Noninflammatory disorder of ovary, fallopian tube and broad ligament, unspecified: Secondary | ICD-10-CM | POA: Diagnosis not present

## 2017-01-25 DIAGNOSIS — N838 Other noninflammatory disorders of ovary, fallopian tube and broad ligament: Secondary | ICD-10-CM

## 2017-01-25 DIAGNOSIS — D259 Leiomyoma of uterus, unspecified: Secondary | ICD-10-CM

## 2017-01-25 NOTE — H&P (Signed)
[name] [name] 1965-12-15 [id number]   History and Physical  Chief complaint: Leiomyoma, right ovarian mass, pelvic pressure/discomfort  History of present illness: 51 y.o. [id number] with a long history of leiomyoma and persistent right ovarian cystic mass suspicious for dermoid.  Serial ultrasounds showed stability/slow increase in left ovarian cystic measurement with normal CA 125's.  Patient is having some pelvic pressure and discomfort but is most bothered by the thought of having this cystic mass in her pelvis and wants to proceed with surgical removal.  The patient also has not had a period in several years with elevated FSH consistent with menopause/perimenopause.  She is not having significant hot flushes or night sweats as well as no vaginal dryness.  Options for management were reviewed with the patient to include continued expectant management, attempted robotic approach versus TAH.  She has 2 prior cesarean sections and one prior ectopic surgery uterus well supported.  The pros and cons of each approach was discussed with the patient and she ultimately has elected to proceed with TAH/BSO.  Past Medical History:  Diagnosis Date  . Ectopic pregnancy 1990   HISTORY OF  . Hypertension   . Hypothyroid     Past Surgical History:  Procedure Laterality Date  . [place], 2006   BTL AT 2006 C/S  . [place]  . HYSTEROSCOPY  11/2008   HYSTEROSCOPY, D&C  . TUBAL LIGATION  2006   AT 2006 C/S  . tummy tuck  01/2015    Family History  Problem Relation Age of Onset  . Breast cancer Paternal Grandmother 72  . Hypertension Mother   . Diabetes Mother   . Hypertension Father   . Cancer Father        Lung cancer  . Hypertension Sister   . Heart disease Maternal Grandmother   . Breast cancer Sister     Social History:  reports that she has quit smoking. she has never used smokeless tobacco. She reports that she drinks about 3.6 oz of alcohol  per week. She reports that she does not use drugs.  Allergies: No Known Allergies  Medications: See Epic for most current list  ROS:  Was performed and pertinent positives and negatives are included in the history of present illness.  Exam: [name] assistant Vitals:   01/25/17 1507  BP: 126/80   General: well developed, well nourished female, no acute distress HEENT: normal  Lungs: clear to auscultation without wheezing, rales or rhonchi  Cardiac: regular rate without rubs, murmurs or gallops  Abdomen: soft, nontender without masses, guarding, rebound, organomegaly  Pelvic: external bus vagina: normal   Cervix: grossly normal high in the vagina with little descent with palpation Uterus:  bulky filling the pelvis. Adnexa: without masses or tenderness    Assessment/Plan:  51 y.o. U2P5361 with long history of leiomyoma and right ovarian cystic mass suspicious for dermoid.  Options for management reviewed as noted above and patient elects for TAH/BSO.  Sexuality following hysterectomy was reviewed and the risk of persistent dyspareunia orgasmic dysfunction following the procedure was discussed.  The ovarian conservation issue was reviewed and a perimenopausal timeframe and the options to keep her one ovary if it appears normal versus removing both ovaries discussed.  Risks of leaving her one ovary to include ovarian disease in the future up to and including ovarian cancer versus removing both ovaries and the risks of developing symptoms such as hot flashes night sweats vaginal dryness also discussed.  Options  for hormone replacement therapy following the procedure reviewed and what is involved with this to include the risks such as thrombosis stroke heart attack DVT in the whole breast cancer issue was discussed.  The patient strongly wants to have both ovaries removed and would accept the possibility of hormone replacement following the procedure and its inherent risks.  The expected  intraoperative and postoperative courses as well as the recovery period were reviewed. The risks of infection, prolonged antibiotics, reoperation for abscess or hematoma formation was discussed. The risks of hemorrhage necessitating transfusion and the risks of transfusion reaction, hepatitis, HIV, mad cow disease and other unknown entities was also discussed. Incisional complications to include opening and draining of incisions and closure by secondary intention, dehiscence and long-term issues of keloid/cosmetics and hernia formation were reviewed.  She did have a abdominoplasty previously and we discussed that there are no guarantees with healing with the TAH incision as far as cosmetics.  The risk of inadvertent injury to internal organs including bowel, bladder, ureters, vessels, nerves either immediately recognized or delay recognized necessitating major exploratory reparative surgeries and future reparative surgeries including bowel resection, ostomy formation, bladder repair, ureteral damage repair was discussed with her. The patient's questions were answered to her satisfaction and she is ready to proceed with surgery.    [name] MD, 4:45 PM 01/25/2017

## 2017-01-25 NOTE — Progress Notes (Signed)
[name] [name] 03-Dec-1965 [id number]   Preoperative consult  Chief complaint: Leiomyoma, right ovarian mass, pelvic pressure/discomfort  History of present illness: 51 y.o. H4T6546 with a long history of leiomyoma and persistent right ovarian cystic mass suspicious for dermoid.  Serial ultrasounds showed stability/slow increase in left ovarian cystic measurement with normal CA 125's.  Patient is having some pelvic pressure and discomfort but is most bothered by the thought of having this cystic mass in her pelvis and wants to proceed with surgical removal.  The patient also has not had a period in several years with elevated FSH consistent with menopause/perimenopause.  She is not having significant hot flushes or night sweats as well as no vaginal dryness.  Options for management were reviewed with the patient to include continued expectant management, attempted robotic approach versus TAH.  She has 2 prior cesarean sections and one prior ectopic surgery uterus well supported.  The pros and cons of each approach was discussed with the patient and she ultimately has elected to proceed with TAH/BSO.  Past Medical History:  Diagnosis Date  . Ectopic pregnancy 1990   HISTORY OF  . Hypertension   . Hypothyroid     Past Surgical History:  Procedure Laterality Date  . [place], 2006   BTL AT 2006 C/S  . [place]  . HYSTEROSCOPY  11/2008   HYSTEROSCOPY, D&C  . TUBAL LIGATION  2006   AT 2006 C/S  . tummy tuck  01/2015    Family History  Problem Relation Age of Onset  . Breast cancer Paternal Grandmother 51  . Hypertension Mother   . Diabetes Mother   . Hypertension Father   . Cancer Father        Lung cancer  . Hypertension Sister   . Heart disease Maternal Grandmother   . Breast cancer Sister     Social History:  reports that she has quit smoking. she has never used smokeless tobacco. She reports that she drinks about 3.6 oz of alcohol  per week. She reports that she does not use drugs.  Allergies: No Known Allergies  Medications: See Epic for most current list  ROS:  Was performed and pertinent positives and negatives are included in the history of present illness.  Exam: [name] assistant Vitals:   01/25/17 1507  BP: 126/80   General: well developed, well nourished female, no acute distress HEENT: normal  Lungs: clear to auscultation without wheezing, rales or rhonchi  Cardiac: regular rate without rubs, murmurs or gallops  Abdomen: soft, nontender without masses, guarding, rebound, organomegaly  Pelvic: external bus vagina: normal   Cervix: grossly normal high in the vagina with little descent with palpation Uterus:  bulky filling the pelvis. Adnexa: without masses or tenderness    Assessment/Plan:  51 y.o. T0P5465 with long history of leiomyoma and right ovarian cystic mass suspicious for dermoid.  Options for management reviewed as noted above and patient elects for TAH/BSO.  Sexuality following hysterectomy was reviewed and the risk of persistent dyspareunia orgasmic dysfunction following the procedure was discussed.  The ovarian conservation issue was reviewed and a perimenopausal timeframe and the options to keep her one ovary if it appears normal versus removing both ovaries discussed.  Risks of leaving her one ovary to include ovarian disease in the future up to and including ovarian cancer versus removing both ovaries and the risks of developing symptoms such as hot flashes night sweats vaginal dryness also discussed.  Options for  hormone replacement therapy following the procedure reviewed and what is involved with this to include the risks such as thrombosis stroke heart attack DVT in the whole breast cancer issue was discussed.  The patient strongly wants to have both ovaries removed and would accept the possibility of hormone replacement following the procedure and its inherent risks.  The expected  intraoperative and postoperative courses as well as the recovery period were reviewed. The risks of infection, prolonged antibiotics, reoperation for abscess or hematoma formation was discussed. The risks of hemorrhage necessitating transfusion and the risks of transfusion reaction, hepatitis, HIV, mad cow disease and other unknown entities was also discussed. Incisional complications to include opening and draining of incisions and closure by secondary intention, dehiscence and long-term issues of keloid/cosmetics and hernia formation were reviewed.  She did have a abdominoplasty previously and we discussed that there are no guarantees with healing with the TAH incision as far as cosmetics.  The risk of inadvertent injury to internal organs including bowel, bladder, ureters, vessels, nerves either immediately recognized or delay recognized necessitating major exploratory reparative surgeries and future reparative surgeries including bowel resection, ostomy formation, bladder repair, ureteral damage repair was discussed with her. The patient's questions were answered to her satisfaction and she is ready to proceed with surgery.      [name] MD, 3:41 PM 01/25/2017

## 2017-01-25 NOTE — Patient Instructions (Addendum)
Your procedure is scheduled on:  Tuesday, Nov 27  Enter through the Main Entrance of [facility] at:  7:30 am  Pick up the phone at the desk and dial [phone].  Call this number if you have problems the morning of surgery: [phone].  Remember: Do NOT eat food or drink clear liquids (including water) after midnight Monday  Take these medicines the morning of surgery with a SIP OF WATER:  Levothyroxine  Stop herbal medications and supplements at this time.  Do NOT wear jewelry (body piercing), metal hair clips/bobby pins, make-up, or nail polish. Do NOT wear lotions, powders, or perfumes.  You may wear deoderant. Do NOT shave for 48 hours prior to surgery. Do NOT bring valuables to the hospital.  Leave suitcase in car.  After surgery it may be brought to your room.  For patients admitted to the hospital, checkout time is 11:00 AM the day of discharge. Have a responsible adult drive you home and stay with you for 24 hours after your procedure.   Home with husband [name] [name] cell [phone].

## 2017-01-25 NOTE — Patient Instructions (Signed)
Followup for surgery as scheduled. 

## 2017-01-26 ENCOUNTER — Other Ambulatory Visit: Payer: Self-pay

## 2017-01-26 ENCOUNTER — Encounter (HOSPITAL_COMMUNITY)
Admission: RE | Admit: 2017-01-26 | Discharge: 2017-01-26 | Disposition: A | Discharge status: Home / Self Care | Payer: 59 | Source: Ambulatory Visit | Attending: Gynecology | Admitting: Gynecology

## 2017-01-26 ENCOUNTER — Encounter (HOSPITAL_COMMUNITY): Payer: Self-pay

## 2017-01-26 DIAGNOSIS — Z01812 Encounter for preprocedural laboratory examination: Secondary | ICD-10-CM | POA: Diagnosis not present

## 2017-01-26 DIAGNOSIS — Z01810 Encounter for preprocedural cardiovascular examination: Secondary | ICD-10-CM | POA: Insufficient documentation

## 2017-01-26 HISTORY — DX: Papillomavirus as the cause of diseases classified elsewhere: B97.7

## 2017-01-26 HISTORY — DX: Anemia, unspecified: D64.9

## 2017-01-26 LAB — COMPREHENSIVE METABOLIC PANEL
ALT: 24 U/L (ref 14–54)
AST: 18 U/L (ref 15–41)
Albumin: 4.1 g/dL (ref 3.5–5.0)
Alkaline Phosphatase: 65 U/L (ref 38–126)
Anion gap: 8 (ref 5–15)
BUN: 20 mg/dL (ref 6–20)
CHLORIDE: 107 mmol/L (ref 101–111)
CO2: 28 mmol/L (ref 22–32)
Calcium: 9.4 mg/dL (ref 8.9–10.3)
Creatinine, Ser: 0.91 mg/dL (ref 0.44–1.00)
Glucose, Bld: 112 mg/dL — ABNORMAL HIGH (ref 65–99)
POTASSIUM: 3.6 mmol/L (ref 3.5–5.1)
SODIUM: 143 mmol/L (ref 135–145)
Total Bilirubin: 0.2 mg/dL — ABNORMAL LOW (ref 0.3–1.2)
Total Protein: 8.7 g/dL — ABNORMAL HIGH (ref 6.5–8.1)

## 2017-01-26 LAB — CBC
HCT: 37.4 % (ref 36.0–46.0)
Hemoglobin: 12.6 g/dL (ref 12.0–15.0)
MCH: 30.6 pg (ref 26.0–34.0)
MCHC: 33.7 g/dL (ref 30.0–36.0)
MCV: 90.8 fL (ref 78.0–100.0)
PLATELETS: 196 10*3/uL (ref 150–400)
RBC: 4.12 MIL/uL (ref 3.87–5.11)
RDW: 13.7 % (ref 11.5–15.5)
WBC: 4.5 10*3/uL (ref 4.0–10.5)

## 2017-01-26 NOTE — Pre-Procedure Instructions (Signed)
Reviewed medical history, medication and EKG with Dr. Jillyn Hidden.  Orchid for surgery.  No orders given.

## 2017-01-26 NOTE — Pre-Procedure Instructions (Signed)
[name] [name] at Dr. Zelphia Cairo office to let her know that patient did not sign consent form.  LMOM.  Patient states the consent is incorrect.  Patient states MD to remove both ovaries and one fallopian tube.  Informed patient that we will sign consent form on day of surgery after talking with MD.  Patient verbalizes understanding and is ok to sign consent on day of surgery.

## 2017-01-27 ENCOUNTER — Encounter: Payer: Self-pay | Admitting: Gynecology

## 2017-02-01 ENCOUNTER — Encounter: Payer: Self-pay | Admitting: Gynecology

## 2017-02-01 ENCOUNTER — Encounter (HOSPITAL_COMMUNITY): Payer: Self-pay | Admitting: Anesthesiology

## 2017-02-02 ENCOUNTER — Encounter (HOSPITAL_COMMUNITY)
Admission: AD | Disposition: A | Discharge status: Home / Self Care | Payer: Self-pay | Source: Ambulatory Visit | Attending: Gynecology

## 2017-02-02 ENCOUNTER — Inpatient Hospital Stay (HOSPITAL_COMMUNITY): Payer: 59 | Admitting: Anesthesiology

## 2017-02-02 ENCOUNTER — Encounter: Payer: Self-pay | Admitting: Anesthesiology

## 2017-02-02 ENCOUNTER — Encounter (HOSPITAL_COMMUNITY): Payer: Self-pay

## 2017-02-02 ENCOUNTER — Inpatient Hospital Stay (HOSPITAL_COMMUNITY)
Admission: AD | Admit: 2017-02-02 | Discharge: 2017-02-03 | DRG: 743 | Disposition: A | Discharge status: Home / Self Care | Payer: 59 | Source: Ambulatory Visit | Attending: Gynecology | Admitting: Gynecology

## 2017-02-02 ENCOUNTER — Other Ambulatory Visit: Payer: Self-pay

## 2017-02-02 DIAGNOSIS — I10 Essential (primary) hypertension: Secondary | ICD-10-CM | POA: Diagnosis present

## 2017-02-02 DIAGNOSIS — D270 Benign neoplasm of right ovary: Secondary | ICD-10-CM

## 2017-02-02 DIAGNOSIS — N800 Endometriosis of uterus: Secondary | ICD-10-CM | POA: Diagnosis present

## 2017-02-02 DIAGNOSIS — N802 Endometriosis of fallopian tube: Secondary | ICD-10-CM | POA: Diagnosis present

## 2017-02-02 DIAGNOSIS — Z87891 Personal history of nicotine dependence: Secondary | ICD-10-CM

## 2017-02-02 DIAGNOSIS — R102 Pelvic and perineal pain: Secondary | ICD-10-CM | POA: Diagnosis not present

## 2017-02-02 DIAGNOSIS — D4959 Neoplasm of unspecified behavior of other genitourinary organ: Secondary | ICD-10-CM | POA: Diagnosis present

## 2017-02-02 DIAGNOSIS — D259 Leiomyoma of uterus, unspecified: Secondary | ICD-10-CM

## 2017-02-02 HISTORY — DX: Nausea with vomiting, unspecified: R11.2

## 2017-02-02 HISTORY — DX: Other specified postprocedural states: Z98.890

## 2017-02-02 LAB — TYPE AND SCREEN
ABO/RH(D): A POS
ANTIBODY SCREEN: NEGATIVE

## 2017-02-02 LAB — PREGNANCY, URINE: Preg Test, Ur: NEGATIVE

## 2017-02-02 LAB — ABO/RH: ABO/RH(D): A POS

## 2017-02-02 SURGERY — HYSTERECTOMY, ABDOMINAL, WITH SALPINGO-OOPHORECTOMY
Anesthesia: General | Laterality: Right

## 2017-02-02 MED ORDER — HYDROMORPHONE HCL 1 MG/ML IJ SOLN
INTRAMUSCULAR | Status: AC
  Administered 2017-02-02: 0.5 mg via INTRAVENOUS
  Filled 2017-02-02: qty 0.5

## 2017-02-02 MED ORDER — PROMETHAZINE HCL 25 MG/ML IJ SOLN
6.2500 mg | INTRAMUSCULAR | Status: DC | PRN
Start: 2017-02-02 — End: 2017-02-02

## 2017-02-02 MED ORDER — HYDROMORPHONE HCL 1 MG/ML IJ SOLN
INTRAMUSCULAR | Status: AC
  Filled 2017-02-02: qty 2

## 2017-02-02 MED ORDER — BUPIVACAINE LIPOSOME 1.3 % IJ SUSP
20.0000 mL | Freq: Once | INTRAMUSCULAR | Status: DC
  Filled 2017-02-02: qty 20

## 2017-02-02 MED ORDER — NALOXONE HCL 0.4 MG/ML IJ SOLN: 0.4000 mg | INTRAMUSCULAR | Status: DC | PRN

## 2017-02-02 MED ORDER — DIPHENHYDRAMINE HCL 12.5 MG/5ML PO ELIX: 12.5000 mg | ORAL_SOLUTION | Freq: Four times a day (QID) | ORAL | Status: DC | PRN

## 2017-02-02 MED ORDER — SODIUM CHLORIDE 0.9 % IJ SOLN
INTRAMUSCULAR | Status: AC
  Filled 2017-02-02: qty 10

## 2017-02-02 MED ORDER — HYDROMORPHONE HCL 1 MG/ML IJ SOLN
0.2500 mg | INTRAMUSCULAR | Status: DC | PRN
  Administered 2017-02-02 (×2): 0.5 mg via INTRAVENOUS

## 2017-02-02 MED ORDER — SCOPOLAMINE 1 MG/3DAYS TD PT72
1.0000 | MEDICATED_PATCH | Freq: Once | TRANSDERMAL | Status: DC
  Administered 2017-02-02: 1.5 mg via TRANSDERMAL

## 2017-02-02 MED ORDER — DIPHENHYDRAMINE HCL 50 MG/ML IJ SOLN: 12.5000 mg | Freq: Four times a day (QID) | INTRAMUSCULAR | Status: DC | PRN

## 2017-02-02 MED ORDER — SODIUM CHLORIDE 0.9% FLUSH: 9.0000 mL | INTRAVENOUS | Status: DC | PRN

## 2017-02-02 MED ORDER — ROCURONIUM BROMIDE 100 MG/10ML IV SOLN
INTRAVENOUS | Status: DC | PRN
  Administered 2017-02-02: 50 mg via INTRAVENOUS

## 2017-02-02 MED ORDER — SCOPOLAMINE 1 MG/3DAYS TD PT72
MEDICATED_PATCH | TRANSDERMAL | Status: AC
  Filled 2017-02-02: qty 1

## 2017-02-02 MED ORDER — DEXAMETHASONE SODIUM PHOSPHATE 4 MG/ML IJ SOLN
INTRAMUSCULAR | Status: DC | PRN
  Administered 2017-02-02 (×2): 4 mg via INTRAVENOUS

## 2017-02-02 MED ORDER — HYDROMORPHONE 1 MG/ML IV SOLN
INTRAVENOUS | Status: DC
  Administered 2017-02-02: 21:00:00 via INTRAVENOUS
  Filled 2017-02-02: qty 25

## 2017-02-02 MED ORDER — LABETALOL HCL 5 MG/ML IV SOLN
5.0000 mg | Freq: Once | INTRAVENOUS | Status: AC
  Administered 2017-02-02: 5 mg via INTRAVENOUS

## 2017-02-02 MED ORDER — DEXAMETHASONE SODIUM PHOSPHATE 4 MG/ML IJ SOLN
INTRAMUSCULAR | Status: AC
  Filled 2017-02-02: qty 2

## 2017-02-02 MED ORDER — MIDAZOLAM HCL 2 MG/2ML IJ SOLN
INTRAMUSCULAR | Status: DC | PRN
  Administered 2017-02-02: 2 mg via INTRAVENOUS

## 2017-02-02 MED ORDER — BUPIVACAINE LIPOSOME 1.3 % IJ SUSP
INTRAMUSCULAR | Status: DC | PRN
  Administered 2017-02-02: 20 mL

## 2017-02-02 MED ORDER — LABETALOL HCL 5 MG/ML IV SOLN
INTRAVENOUS | Status: DC | PRN
  Administered 2017-02-02: 5 mg via INTRAVENOUS

## 2017-02-02 MED ORDER — FENTANYL CITRATE (PF) 100 MCG/2ML IJ SOLN
INTRAMUSCULAR | Status: DC | PRN
  Administered 2017-02-02: 100 ug via INTRAVENOUS
  Administered 2017-02-02: 50 ug via INTRAVENOUS
  Administered 2017-02-02: 100 ug via INTRAVENOUS

## 2017-02-02 MED ORDER — ONDANSETRON HCL 4 MG/2ML IJ SOLN
4.0000 mg | Freq: Four times a day (QID) | INTRAMUSCULAR | Status: DC | PRN
Start: 2017-02-02 — End: 2017-02-03
  Administered 2017-02-02 – 2017-02-03 (×2): 4 mg via INTRAVENOUS
  Filled 2017-02-02 (×2): qty 2

## 2017-02-02 MED ORDER — KETOROLAC TROMETHAMINE 30 MG/ML IJ SOLN
30.0000 mg | Freq: Four times a day (QID) | INTRAMUSCULAR | Status: DC
  Administered 2017-02-02 – 2017-02-03 (×4): 30 mg via INTRAVENOUS
  Filled 2017-02-02 (×3): qty 1

## 2017-02-02 MED ORDER — LABETALOL HCL 5 MG/ML IV SOLN
INTRAVENOUS | Status: AC
Start: 2017-02-02 — End: 2017-02-02
  Administered 2017-02-02: 5 mg via INTRAVENOUS
  Filled 2017-02-02: qty 4

## 2017-02-02 MED ORDER — LIDOCAINE HCL (CARDIAC) 20 MG/ML IV SOLN
INTRAVENOUS | Status: DC | PRN
  Administered 2017-02-02: 60 mg via INTRAVENOUS

## 2017-02-02 MED ORDER — CEFOTETAN DISODIUM-DEXTROSE 2-2.08 GM-%(50ML) IV SOLR
INTRAVENOUS | Status: AC
  Filled 2017-02-02: qty 50

## 2017-02-02 MED ORDER — SODIUM CHLORIDE 0.9 % IJ SOLN
INTRAMUSCULAR | Status: DC | PRN
  Administered 2017-02-02: 30 mL via INTRAVENOUS

## 2017-02-02 MED ORDER — LABETALOL HCL 5 MG/ML IV SOLN: 5.0000 mg | Freq: Once | INTRAVENOUS | Status: DC

## 2017-02-02 MED ORDER — ONDANSETRON HCL 4 MG/2ML IJ SOLN
INTRAMUSCULAR | Status: AC
  Filled 2017-02-02: qty 2

## 2017-02-02 MED ORDER — DEXAMETHASONE SODIUM PHOSPHATE 10 MG/ML IJ SOLN
INTRAMUSCULAR | Status: AC
  Filled 2017-02-02: qty 1

## 2017-02-02 MED ORDER — HYDROMORPHONE HCL 1 MG/ML IJ SOLN
INTRAMUSCULAR | Status: DC | PRN
  Administered 2017-02-02: 1 mg via INTRAVENOUS

## 2017-02-02 MED ORDER — LABETALOL HCL 5 MG/ML IV SOLN
INTRAVENOUS | Status: AC
  Filled 2017-02-02: qty 4

## 2017-02-02 MED ORDER — HYDROMORPHONE HCL 1 MG/ML IJ SOLN
INTRAMUSCULAR | Status: AC
Start: 2017-02-02 — End: 2017-02-03
  Filled 2017-02-02: qty 0.5

## 2017-02-02 MED ORDER — LACTATED RINGERS IV SOLN
INTRAVENOUS | Status: DC
  Administered 2017-02-02 (×3): via INTRAVENOUS

## 2017-02-02 MED ORDER — ROCURONIUM BROMIDE 100 MG/10ML IV SOLN
INTRAVENOUS | Status: AC
  Filled 2017-02-02: qty 1

## 2017-02-02 MED ORDER — SUGAMMADEX SODIUM 200 MG/2ML IV SOLN
INTRAVENOUS | Status: DC | PRN
  Administered 2017-02-02: 180 mg via INTRAVENOUS

## 2017-02-02 MED ORDER — PROPOFOL 10 MG/ML IV BOLUS
INTRAVENOUS | Status: DC | PRN
  Administered 2017-02-02: 200 mg via INTRAVENOUS

## 2017-02-02 MED ORDER — SODIUM CHLORIDE 0.9 % IJ SOLN
INTRAMUSCULAR | Status: AC
  Filled 2017-02-02: qty 20

## 2017-02-02 MED ORDER — CEFOTETAN DISODIUM-DEXTROSE 2-2.08 GM-%(50ML) IV SOLR
2.0000 g | INTRAVENOUS | Status: AC
  Administered 2017-02-02: 2 g via INTRAVENOUS

## 2017-02-02 MED ORDER — FENTANYL CITRATE (PF) 250 MCG/5ML IJ SOLN
INTRAMUSCULAR | Status: AC
  Filled 2017-02-02: qty 5

## 2017-02-02 MED ORDER — DEXTROSE-NACL 5-0.9 % IV SOLN
INTRAVENOUS | Status: DC
  Administered 2017-02-02 – 2017-02-03 (×2): via INTRAVENOUS

## 2017-02-02 MED ORDER — PROPOFOL 10 MG/ML IV BOLUS
INTRAVENOUS | Status: AC
  Filled 2017-02-02: qty 20

## 2017-02-02 MED ORDER — ONDANSETRON HCL 4 MG/2ML IJ SOLN
INTRAMUSCULAR | Status: DC | PRN
  Administered 2017-02-02: 4 mg via INTRAVENOUS

## 2017-02-02 MED ORDER — KETOROLAC TROMETHAMINE 30 MG/ML IJ SOLN: 30.0000 mg | Freq: Once | INTRAMUSCULAR | Status: DC

## 2017-02-02 MED ORDER — KETOROLAC TROMETHAMINE 30 MG/ML IJ SOLN
30.0000 mg | Freq: Four times a day (QID) | INTRAMUSCULAR | Status: DC
  Filled 2017-02-02 (×2): qty 1

## 2017-02-02 MED ORDER — ONDANSETRON HCL 4 MG/2ML IJ SOLN: 4.0000 mg | Freq: Four times a day (QID) | INTRAMUSCULAR | Status: DC | PRN

## 2017-02-02 MED ORDER — MORPHINE SULFATE 2 MG/ML IV SOLN
INTRAVENOUS | Status: DC
  Administered 2017-02-02: 13.5 mg via INTRAVENOUS
  Administered 2017-02-02: 14:00:00 via INTRAVENOUS
  Filled 2017-02-02: qty 30

## 2017-02-02 MED ORDER — SUGAMMADEX SODIUM 200 MG/2ML IV SOLN
INTRAVENOUS | Status: AC
  Filled 2017-02-02: qty 2

## 2017-02-02 MED ORDER — MIDAZOLAM HCL 2 MG/2ML IJ SOLN
INTRAMUSCULAR | Status: AC
  Filled 2017-02-02: qty 2

## 2017-02-02 SURGICAL SUPPLY — 36 items
APL SKNCLS STERI-STRIP NONHPOA (GAUZE/BANDAGES/DRESSINGS) ×2
BENZOIN TINCTURE PRP APPL 2/3 (GAUZE/BANDAGES/DRESSINGS) ×2 IMPLANT
CANISTER SUCT 3000ML PPV (MISCELLANEOUS) ×3 IMPLANT
CLOTH BEACON ORANGE TIMEOUT ST (SAFETY) ×1 IMPLANT
CONT PATH 16OZ SNAP LID 3702 (MISCELLANEOUS) ×3 IMPLANT
DECANTER SPIKE VIAL GLASS SM (MISCELLANEOUS) IMPLANT
DRAPE CESAREAN BIRTH W POUCH (DRAPES) ×3 IMPLANT
DRAPE WARM FLUID 44X44 (DRAPE) ×2 IMPLANT
DRSG OPSITE POSTOP 4X10 (GAUZE/BANDAGES/DRESSINGS) ×3 IMPLANT
DURAPREP 26ML APPLICATOR (WOUND CARE) ×3 IMPLANT
ELECT CAUTERY BLADE 6.4 (BLADE) ×2 IMPLANT
GAUZE SPONGE 4X4 16PLY XRAY LF (GAUZE/BANDAGES/DRESSINGS) ×3 IMPLANT
GLOVE BIO SURGEON STRL SZ7 (GLOVE) ×3 IMPLANT
GLOVE BIO SURGEON STRL SZ7.5 (GLOVE) ×3 IMPLANT
GLOVE BIOGEL PI IND STRL 7.0 (GLOVE) ×6 IMPLANT
GLOVE BIOGEL PI INDICATOR 7.0 (GLOVE) ×3
GOWN STRL REUS W/TWL LRG LVL3 (GOWN DISPOSABLE) ×9 IMPLANT
HEMOSTAT ARISTA ABSORB 3G PWDR (MISCELLANEOUS) ×2 IMPLANT
NEEDLE HYPO 22GX1.5 SAFETY (NEEDLE) ×3 IMPLANT
NS IRRIG 1000ML POUR BTL (IV SOLUTION) ×5 IMPLANT
PACK ABDOMINAL GYN (CUSTOM PROCEDURE TRAY) ×3 IMPLANT
PAD OB MATERNITY 4.3X12.25 (PERSONAL CARE ITEMS) ×3 IMPLANT
PROTECTOR NERVE ULNAR (MISCELLANEOUS) ×3 IMPLANT
RETAINER VISCERAL (MISCELLANEOUS) IMPLANT
SPONGE LAP 18X18 X RAY DECT (DISPOSABLE) IMPLANT
STRIP CLOSURE SKIN 1/2X4 (GAUZE/BANDAGES/DRESSINGS) ×2 IMPLANT
SUT VIC AB 0 CT1 18XCR BRD8 (SUTURE) ×5 IMPLANT
SUT VIC AB 0 CT1 27 (SUTURE) ×9
SUT VIC AB 0 CT1 27XBRD ANBCTR (SUTURE) ×6 IMPLANT
SUT VIC AB 0 CT1 8-18 (SUTURE) ×6
SUT VIC AB 2-0 CT1 (SUTURE) ×3 IMPLANT
SUT VIC AB 4-0 KS 27 (SUTURE) ×3 IMPLANT
SUT VICRYL 0 TIES 12 18 (SUTURE) ×3 IMPLANT
SYR CONTROL 10ML LL (SYRINGE) ×3 IMPLANT
TOWEL OR 17X24 6PK STRL BLUE (TOWEL DISPOSABLE) ×6 IMPLANT
TRAY FOLEY CATH SILVER 14FR (SET/KITS/TRAYS/PACK) ×3 IMPLANT

## 2017-02-02 NOTE — Op Note (Signed)
[name] [name] 10-13-1965 [id number]   Post Operative Note   Date of surgery:  02/02/2017  Pre Op Dx: Leiomyoma, right ovarian tumor, pelvic pain  Post Op Dx: Leiomyoma, right ovarian tumor, pelvic pain  Procedure: Total abdominal hysterectomy, bilateral salpingo-oophorectomy, lysis of adhesions  Surgeon:  [name]  Assistant:  Princess Bruins  Anesthesia:  General  EBL: 175 cc anesthesia reported  Complications:  None  Specimen: Opening cell washing, uterus, bilateral ovaries to include right ovarian tumor and bilateral fallopian tubes to pathology  Findings: Omental to left lower anterior abdominal wall adhesions, lysed upon entry into the abdomen.  Anterior cul-de-sac normal, posterior cul-de-sac normal.  Uterus mildly enlarged with leiomyoma, largest measuring approximately 5 cm in fundal location.  Right ovary involved with tumor grossly consistent with mature cystic teratoma.  Removed intact.  Right fallopian tube normal length caliber and fimbriated ends.  Left ovary grossly normal.  Left fallopian tube with proximal segment and absent distal segment consistent with history of prior ectopic pregnancy.  Upper abdominal exam shows appendix grossly normal.  Liver palpates smooth with no palpable upper abdominal disease.  Procedure: The patient was taken to the operating room, underwent general anesthesia, received an abdominal, vaginal/perineal preparation per nursing personnel and an indwelling Foley catheter was placed.  The timeout was performed by the surgical team.  The patient was draped in the usual fashion.  The abdomen was sharply entered through a Pfannenstiel incision staying within the prior scar from her abdominoplasty.  Omental adhesions to the left lower anterior abdominal wall were identified and lysed without difficulty.  A Balfour retractor and bladder blade were placed within the incision and the intestines were packed from the operative field.   An opening cell washing was then obtained and sent to cytology.  The uterus was elevated from the pelvis and the right round ligament was transected with electrocautery and the vesicouterine peritoneal fold was sharply incised to the midline and the bladder flap was initially developed.  The right infundibulopelvic ligament and vessels were skeletonized, doubly clamped, cut and doubly ligated using 0 Vicryl suture.  The ureter was identified away from the surgical site.  The uterine ovarian pedicle was then doubly clamped, cut and the ovarian specimen was removed from the patient.  The left round ligament was again transected with electrocautery, the vesicouterine peritoneal fold sharply incised to meet the incision from the other side and the bladder flap was sharply and bluntly developed without difficulty.  The left infundibulopelvic ligament and vessels were then skeletonized, the ureter identified away from the surgical site and the pedicle was doubly clamped cut and doubly ligated using 0 Vicryl suture.  The right uterine vessels were then skeletonized at the junction of the lower uterine segment and cervix and subsequently clamped cut and ligated using 0 Vicryl suture.  A similar procedure was carried out on the left side.  The uterus was then progressively free from its attachments through clamping cutting and ligating of the parametrial tissues and cardinal ligaments using 0 Vicryl suture.  The bladder flap was progressively developed with sharp and blunt dissection without difficulty and ultimately the vagina was cross clamped below the cervix and the specimen was excised from the patient.  Right and left vaginal angle sutures were placed using 0 Vicryl suture and the intervening vagina was closed anterior to posterior using 0 Vicryl suture in interrupted figure-of-eight stitch.  The pelvis was copiously irrigated and ultimate hemostasis achieved with electrocautery.  Arista was  placed at the bladder flap  and vaginal cuff prophylactically for hemostasis, the bowel packing, Balfour retractor and bladder blade were removed from the patient.  The anterior fascia was reapproximated using 0 Vicryl suture in a running stitch starting at the angle and meeting in the middle.  Subcutaneous tissues were irrigated showing adequate hemostasis and Exparel was injected into the subcutaneous tissues.  The skin was reapproximated using 4-0 Vicryl in a running subcuticular stitch.  Steri-Strips with benzoin applied as was a sterile dressing.  The sponge, needle and instrument count were verified correct.  The specimens identified for pathology.  The patient was awakened without difficulty and taken to recovery room in good condition having tolerated procedure well.     [name] MD, 11:09 AM 02/02/2017

## 2017-02-02 NOTE — Transfer of Care (Signed)
Immediate Anesthesia Transfer of Care Note  Patient: [name] [name]  Procedure(s) Performed: HYSTERECTOMY ABDOMINAL WITH SALPINGO-OOPHORECTOMY (Right )  Patient Location: PACU  Anesthesia Type:General  Level of Consciousness: drowsy and patient cooperative  Airway & Oxygen Therapy: Patient Spontanous Breathing and Patient connected to nasal cannula oxygen  Post-op Assessment: Report given to RN and Post -op Vital signs reviewed and stable  Post vital signs: Reviewed and stable  Last Vitals:  Vitals:   02/02/17 0746  BP: (!) 157/96  Pulse: 77  Resp: 18  Temp: 36.5 C  SpO2: 100%    Last Pain:  Vitals:   02/02/17 0746  TempSrc: Oral      Patients Stated Pain Goal: 3 (21/82/88 3374)  Complications: No apparent anesthesia complications

## 2017-02-02 NOTE — Anesthesia Preprocedure Evaluation (Addendum)
Anesthesia Evaluation  Patient identified by MRN, date of birth, ID band Patient awake    Reviewed: Allergy & Precautions, NPO status , Patient's Chart, lab work & pertinent test results  History of Anesthesia Complications (+) PONV  Airway Mallampati: II  TM Distance: >3 FB Neck ROM: Full    Dental  (+) Dental Advisory Given   Pulmonary former smoker,    breath sounds clear to auscultation       Cardiovascular hypertension, Pt. on medications  Rhythm:Regular Rate:Normal     Neuro/Psych negative neurological ROS     GI/Hepatic negative GI ROS, Neg liver ROS,   Endo/Other  Hypothyroidism   Renal/GU negative Renal ROS     Musculoskeletal   Abdominal   Peds  Hematology negative hematology ROS (+)   Anesthesia Other Findings   Reproductive/Obstetrics                             Lab Results  Component Value Date   WBC 4.5 01/26/2017   HGB 12.6 01/26/2017   HCT 37.4 01/26/2017   MCV 90.8 01/26/2017   PLT 196 01/26/2017   Lab Results  Component Value Date   CREATININE 0.91 01/26/2017   BUN 20 01/26/2017   NA 143 01/26/2017   K 3.6 01/26/2017   CL 107 01/26/2017   CO2 28 01/26/2017    Anesthesia Physical Anesthesia Plan  ASA: II  Anesthesia Plan: General   Post-op Pain Management:    Induction: Intravenous  PONV Risk Score and Plan: 4 or greater and Ondansetron, Dexamethasone, Scopolamine patch - Pre-op and Midazolam  Airway Management Planned: Oral ETT  Additional Equipment:   Intra-op Plan:   Post-operative Plan: Extubation in OR  Informed Consent: I have reviewed the patients History and Physical, chart, labs and discussed the procedure including the risks, benefits and alternatives for the proposed anesthesia with the patient or authorized representative who has indicated his/her understanding and acceptance.   Dental advisory given  Plan Discussed with:  CRNA  Anesthesia Plan Comments:         Anesthesia Quick Evaluation

## 2017-02-02 NOTE — Anesthesia Postprocedure Evaluation (Signed)
Anesthesia Post Note  Patient: [name] [name]  Procedure(s) Performed: HYSTERECTOMY ABDOMINAL WITH SALPINGO-OOPHORECTOMY (Right )     Patient location during evaluation: PACU Anesthesia Type: General Level of consciousness: awake and alert Pain management: pain level controlled Vital Signs Assessment: post-procedure vital signs reviewed and stable Respiratory status: spontaneous breathing, nonlabored ventilation, respiratory function stable and patient connected to nasal cannula oxygen Cardiovascular status: blood pressure returned to baseline and stable Postop Assessment: no apparent nausea or vomiting Anesthetic complications: no    Last Vitals:  Vitals:   02/02/17 1354 02/02/17 1430  BP:  (!) 162/86  Pulse:  61  Resp: 15 17  Temp:  36.5 C  SpO2: 100% 100%    Last Pain:  Vitals:   02/02/17 1430  TempSrc: Oral  PainSc:    Pain Goal: Patients Stated Pain Goal: 3 (02/02/17 0746)               Audry Pili

## 2017-02-02 NOTE — H&P (Signed)
The patient was examined.  I reviewed the proposed surgery and consent form with the patient to include remove uterus, both ovaries and any remaining fallopian tubes.  The dictated history and physical is current and accurate and all questions were answered. The patient is ready to proceed with surgery and has a realistic understanding and expectation for the outcome.   [name] MD, 8:28 AM 02/02/2017

## 2017-02-02 NOTE — Anesthesia Procedure Notes (Signed)
Procedure Name: Intubation Date/Time: 02/02/2017 8:57 AM Performed by: Jonna Munro, CRNA Pre-anesthesia Checklist: Patient identified, Emergency Drugs available, Suction available, Patient being monitored and Timeout performed Patient Re-evaluated:Patient Re-evaluated prior to induction Oxygen Delivery Method: Circle system utilized Preoxygenation: Pre-oxygenation with 100% oxygen Induction Type: IV induction Laryngoscope Size: Mac and 3 Grade View: Grade I Tube type: Oral Tube size: 7.0 mm Number of attempts: 1 Airway Equipment and Method: Stylet Placement Confirmation: ETT inserted through vocal cords under direct vision,  positive ETCO2 and breath sounds checked- equal and bilateral Secured at: 22 cm Tube secured with: Tape Dental Injury: Teeth and Oropharynx as per pre-operative assessment

## 2017-02-02 NOTE — Progress Notes (Addendum)
Patient ID: [name] [name], female   DOB: 07/21/1965, 51 y.o.   MRN: [id number] In to see patient Awake, alert with mild to moderate pain, managed with PCA VSS Lungs clear Cardiac RR w/o RMG Abd with bowel sounds, soft with mild pain Dressing dry intact Ext without pain  Results of surgery reviewed with patient.  Dressing replaced in RR.  Appears dry now. Continue with routine PO care. Possible discharge in am

## 2017-02-03 MED ORDER — BISMUTH SUBSALICYLATE 262 MG/15ML PO SUSP
30.0000 mL | ORAL | Status: DC | PRN
  Filled 2017-02-03: qty 118

## 2017-02-03 MED ORDER — ONDANSETRON HCL 4 MG PO TABS: 8.0000 mg | ORAL_TABLET | Freq: Three times a day (TID) | ORAL | Status: DC | PRN

## 2017-02-03 MED ORDER — OXYCODONE-ACETAMINOPHEN 5-325 MG PO TABS
2.0000 | ORAL_TABLET | ORAL | Status: DC | PRN
  Administered 2017-02-03 (×2): 2 via ORAL
  Filled 2017-02-03 (×2): qty 2

## 2017-02-03 MED ORDER — OXYCODONE-ACETAMINOPHEN 5-325 MG PO TABS: 1.0000 | ORAL_TABLET | ORAL | 0 refills | Status: DC | PRN

## 2017-02-03 MED ORDER — ALUM & MAG HYDROXIDE-SIMETH 200-200-20 MG/5ML PO SUSP: 30.0000 mL | Freq: Four times a day (QID) | ORAL | Status: DC | PRN

## 2017-02-03 MED ORDER — DIPHENHYDRAMINE HCL 25 MG PO CAPS: 50.0000 mg | ORAL_CAPSULE | Freq: Four times a day (QID) | ORAL | Status: DC | PRN

## 2017-02-03 NOTE — Progress Notes (Signed)
Patient ID: [name] [name], female   DOB: [birth year], 51 y.o.   MRN: [id number] Makhya Black-Singletary March 01, 1966 [id number]   1 Day Post-Op s/p Procedure(s): HYSTERECTOMY ABDOMINAL WITH SALPINGO-OOPHORECTOMY  Subjective: Patient reports no acute distress, pain severity reported mild, Yes.   taking PO, foley catheter out, Yes.   voiding, Yes.   ambulating, No. passing flatus  Objective: Vital signs in last 24 hours: Temp:  [97.7 F (36.5 C)-98.8 F (37.1 C)] 98.2 F (36.8 C) (11/28 0730) Pulse Rate:  [58-85] 62 (11/28 0730) Resp:  [11-18] 16 (11/28 0730) BP: (120-176)/(64-105) 120/64 (11/28 0730) SpO2:  [95 %-100 %] 100 % (11/28 0730) Weight:  [200 lb (90.7 kg)] 200 lb (90.7 kg) (11/27 1828)    EXAM General: awake, alert and no distress Resp: clear to auscultation bilaterally Cardio: regular rate and rhythm GI: soft, mild tenderness, bowel sounds active, dressing dry Lower Extremities: Without swelling or tenderness Vaginal Bleeding: reported scant   Assessment: s/p Procedure(s): HYSTERECTOMY ABDOMINAL WITH SALPINGO-OOPHORECTOMY: stable and progressing well  Plan: Continue routine post operative care, Advance diet Encourage ambulation Advance to PO medication Discontinue IV fluids  LOS: 1 day    [name] MD, 7:50 AM 02/03/2017

## 2017-02-03 NOTE — Progress Notes (Signed)
Pt discharged home with printed instructions. Pt verbalized an understanding. No concerns noted. Suresh Audi L Kyrian Stage, RN 

## 2017-02-03 NOTE — Progress Notes (Signed)
Pt. Moving towards post op goals. Issues with PONV throughout shift. IV zofran x 2. Pain controlled with toradol (scheduled) and encouragement of dPCA.  Pt. Noted she felt the mPCA which was d/c at beginning of the shift was culprit of n/v.  Using IS with continuous encouragement.  Pt. Walked 1x during shift in hall and 1x to chair.  Pt's drsg noted to be saturated at 0530. HC changed w/sterile gloves.  Site assessed, no appearance of major leakage, leakage must have happened during pt's wreching.  Foley removed POD #1 @ 8295, DTV.  Will continue to monitor.

## 2017-02-03 NOTE — Progress Notes (Signed)
Patient ID: [name] [name], female   DOB: November 09, 1965, 51 y.o.   MRN: [id number] [name] [name] 1965-07-14 [id number]   1 Day Post-Op Procedure(s) (LRB): HYSTERECTOMY ABDOMINAL WITH SALPINGO-OOPHORECTOMY (Right)  Subjective: Patient reports feels well, no acute distress, pain severity reported mild, Yes.   taking PO, Yes.   voiding, Yes.   ambulating, Yes.   passing flatus  Objective: Vital signs in last 24 hours: Temp:  [98.2 F (36.8 C)-98.8 F (37.1 C)] 98.6 F (37 C) (11/28 1306) Pulse Rate:  [62-85] 71 (11/28 1306) Resp:  [15-18] 16 (11/28 1306) BP: (119-151)/(64-89) 119/69 (11/28 1306) SpO2:  [95 %-100 %] 100 % (11/28 1306) Weight:  [200 lb (90.7 kg)] 200 lb (90.7 kg) (11/27 1828)      EXAM General: awake, alert GI: soft, minimal tender, bowel sounds active, dressing removed.  Incision intact, moist but no bleeding. Lower Extremities: Without swelling or tenderness Vaginal Bleeding: Reported scant   Assessment: s/p Procedure(s): HYSTERECTOMY ABDOMINAL WITH SALPINGO-OOPHORECTOMY: progressing well.  Patient desires discharge.  Good pain relief with oral pain meds.  Taking PO well and voiding.  Incision moist but intact without swelling or active drainage/bleeding.  Will loosely dress now and allow open to air with expected healing  Plan: Discharge home today.  Reviewed benign pathology.  Precautions, instructions and follow up were discussed with the patient.  Prescriptions provided per AVS.  Patient to call the office to arrange a post-operative appointmant in 2 weeks.    [name] MD, 5:10 PM 02/03/2017

## 2017-02-03 NOTE — Discharge Instructions (Signed)
°  Postoperative Instructions Hysterectomy ° °Dr. Mavrik Bynum and the nursing staff have discussed postoperative instructions with you.  If you have any questions please ask them before you leave the hospital, or call Dr Vikas Wegmann’s office at [phone].   ° °We would like to emphasize the following instructions: ° ° °  Call the office to make your follow-up appointment as recommended by Dr Siyah Mault (usually 2 weeks). ° °  You were given a prescription, or one was ordered for you at the pharmacy you designated.  Get that prescription filled and take the medication according to instructions. ° °  You may eat a regular diet, but slowly until you start having bowel movements. ° °  Drink plenty of water daily. ° °  Nothing in the vagina (intercourse, douching, objects of any kind) until released by Dr Kachina Niederer. ° °  No driving for two weeks.  Wait to be cleared by Dr Tel Hevia at your first post op check.  Car rides (short) are ok after several days at home, as long as you are not having significant pain, but no traveling out of town. ° °  You may shower, but no baths.  Walking up and down stairs is ok.  No heavy lifting, prolonged standing, repeated bending or any “working out” until your first post op check. ° °  Rest frequently, listen to your body and do not push yourself and overdo it. ° °  Call if: ° °o Your pain medication does not seem strong enough. °o Worsening pain or abdominal bloating °o Persistent nausea or vomiting °o Difficulty with urination or bowel movements. °o Temperature of 101 degrees or higher. °o Bleeding heavier then staining (clots or period type flow). °o Incisions become red, tender or begin to drain. °o You have any questions or concerns. °

## 2017-02-04 NOTE — Discharge Summary (Signed)
[name] [name] Jul 31, 1965 [id number]   Discharge Summary  Date of Admission:  02/02/2017  Date of Discharge:  02/03/2017  Discharge Diagnosis: Mature cystic teratoma, leiomyoma, endometriosis, pelvic pain,  Procedure:  Procedure(s): HYSTERECTOMY ABDOMINAL WITH SALPINGO-OOPHORECTOMY  Pathology: - CERVIX: SQUAMOUS METAPLASIA AND NABOTHIAN CYST. NO DYSPLASIA IDENTIFIED. - ENDOMETRIUM: INACTIVE. NO HYPERPLASIA OR MALIGNANCY. - MYOMETRIUM: ADENOMYOSIS. LEIOMYOMATA WITH DEGENERATIVE CHANGES. NO EVIDENCE OF MALIGNANCY. - RIGHT OVARY: MATURE CYSTIC TERATOMA. NO IMMATURE OR MALIGNANT FEATURES. - RIGHT FALLOPIAN TUBE: UNREMARKABLE. NO ENDOMETRIOSIS OR MALIGNANCY. - LEFT OVARY: FOCAL ENDOSALPINGIOSIS. NO ENDOMETRIOSIS OR MALIGNANCY. - LEFT FALLOPIAN TUBE: ENDOMETRIOSIS. NO EVIDENCE OF MALIGNANCY.  Hospital Course: Patient underwent an uncomplicated TAH/BSO 41/74/0814.  The patient was discharged on postoperative day #1 ambulate well, tolerating a regular diet, voiding without difficulty with good pain relief on oral medication.  The patient received instructions for postoperative care and call precautions.  She received prescriptions per AVS and will be seen in the office 2 weeks following discharge.       [name] MD, 8:04 AM 02/04/2017

## 2017-02-16 ENCOUNTER — Ambulatory Visit (INDEPENDENT_AMBULATORY_CARE_PROVIDER_SITE_OTHER): Payer: 59 | Admitting: Gynecology

## 2017-02-16 ENCOUNTER — Encounter: Payer: Self-pay | Admitting: Gynecology

## 2017-02-16 VITALS — BP 120/80

## 2017-02-16 DIAGNOSIS — Z09 Encounter for follow-up examination after completed treatment for conditions other than malignant neoplasm: Secondary | ICD-10-CM

## 2017-02-16 NOTE — Progress Notes (Signed)
    [name] [name] 22-Jul-1965 [id number]        51 y.o.  [id number] presents for postoperative visit status post TAH/BSO for dermoid and leiomyoma.  Has done well without complaints.  Past medical history,surgical history, problem list, medications, allergies, family history and social history were all reviewed and documented in the EPIC chart.  Directed ROS with pertinent positives and negatives documented in the history of present illness/assessment and plan.  Exam: [name] [name] assistant Vitals:   02/16/17 1438  BP: 120/80   General appearance:  Normal Abdomen soft nontender without masses guarding rebound.  Incision healed nicely. Pelvic external BUS vagina with cuff healing nicely.  Bimanual without masses or tenderness  Assessment/Plan:  51 y.o. [id number] with normal postoperative visit status post TAH/BSO.  Reviewed pathology with her and her husband which showed mature cystic teratoma, leiomyoma, endometriosis and endosalpingosis.  Is doing well without significant menopausal symptoms.  Will slowly resume normal activities with the exception of continue pelvic rest and no heavy lifting or straining and will follow-up in 2 weeks for her next postoperative visit.    [name] Auerbach MD, 3:09 PM 02/16/2017

## 2017-02-16 NOTE — Patient Instructions (Signed)
All up in 2 weeks for your next postoperative visit

## 2017-02-23 ENCOUNTER — Telehealth: Payer: Self-pay

## 2017-02-23 NOTE — Telephone Encounter (Signed)
Rep called to confirm that estimated return to work date still 03/31/17.  I confirmed that surgery was performed as planned and at this time est RTW date is 03/31/17.

## 2017-03-08 ENCOUNTER — Ambulatory Visit (INDEPENDENT_AMBULATORY_CARE_PROVIDER_SITE_OTHER): Payer: 59 | Admitting: Gynecology

## 2017-03-08 ENCOUNTER — Encounter: Payer: Self-pay | Admitting: Gynecology

## 2017-03-08 VITALS — BP 130/80

## 2017-03-08 DIAGNOSIS — Z9889 Other specified postprocedural states: Secondary | ICD-10-CM

## 2017-03-08 DIAGNOSIS — E039 Hypothyroidism, unspecified: Secondary | ICD-10-CM | POA: Diagnosis not present

## 2017-03-08 NOTE — Progress Notes (Signed)
    [name] [name] Mar 19, 1965 [id number]        51 y.o.  [id number] presents for her 4-week postoperative visit status post TAH/BSO for dermoid and leiomyoma.  Doing well without complaints.  Not using HRT and without signs of menopause.  Past medical history,surgical history, problem list, medications, allergies, family history and social history were all reviewed and documented in the EPIC chart.  Directed ROS with pertinent positives and negatives documented in the history of present illness/assessment and plan.  Exam: [name] [name] assistant Vitals:   03/08/17 0927  BP: 130/80   General appearance:  Normal Abdomen soft nontender.  Abdominal incision healed nicely.  Several areas where suture has worked its way to the skin.  These small suture areas were excised Pelvic external BUS vagina with cuff healing nicely.  Bimanual without masses or tenderness  Assessment/Plan:  51 y.o. [id number] with normal postoperative visit.  Several small sutures that had worked its way to the skin were excised.  Incision otherwise healed nicely.  Will slowly resume all activities with the exception of continue pelvic rest and no strenuous abdominal activities for 4 more weeks.  We will schedule her annual exam in several months.  Follow-up sooner if any issues.    [name] Auerbach MD, 9:45 AM 03/08/2017

## 2017-03-08 NOTE — Patient Instructions (Signed)
Slowly resume all normal activities with the exception of nothing in the vagina for another 4 weeks.  Also no strenuous abdominal straining activities for 4 weeks.  Follow-up for your annual exam in several months

## 2017-03-23 DIAGNOSIS — E039 Hypothyroidism, unspecified: Secondary | ICD-10-CM | POA: Diagnosis not present

## 2017-03-29 ENCOUNTER — Encounter: Payer: 59 | Admitting: Women's Health

## 2017-03-29 ENCOUNTER — Other Ambulatory Visit: Payer: 59

## 2017-03-29 DIAGNOSIS — Z1159 Encounter for screening for other viral diseases: Secondary | ICD-10-CM | POA: Diagnosis not present

## 2017-03-29 DIAGNOSIS — Z111 Encounter for screening for respiratory tuberculosis: Secondary | ICD-10-CM | POA: Diagnosis not present

## 2017-04-16 DIAGNOSIS — Z111 Encounter for screening for respiratory tuberculosis: Secondary | ICD-10-CM | POA: Diagnosis not present

## 2017-05-04 DIAGNOSIS — E039 Hypothyroidism, unspecified: Secondary | ICD-10-CM | POA: Diagnosis not present

## 2017-05-11 DIAGNOSIS — E039 Hypothyroidism, unspecified: Secondary | ICD-10-CM | POA: Diagnosis not present

## 2017-05-11 DIAGNOSIS — R946 Abnormal results of thyroid function studies: Secondary | ICD-10-CM | POA: Diagnosis not present

## 2017-05-17 ENCOUNTER — Other Ambulatory Visit: Payer: Self-pay | Admitting: Women's Health

## 2017-05-17 DIAGNOSIS — Z1231 Encounter for screening mammogram for malignant neoplasm of breast: Secondary | ICD-10-CM

## 2017-05-18 ENCOUNTER — Encounter: Payer: 59 | Admitting: Women's Health

## 2017-05-24 ENCOUNTER — Encounter: Payer: Self-pay | Admitting: Women's Health

## 2017-05-24 ENCOUNTER — Ambulatory Visit (INDEPENDENT_AMBULATORY_CARE_PROVIDER_SITE_OTHER): Payer: 59 | Admitting: Women's Health

## 2017-05-24 VITALS — BP 134/80 | Ht 63.0 in | Wt 199.0 lb

## 2017-05-24 DIAGNOSIS — Z01419 Encounter for gynecological examination (general) (routine) without abnormal findings: Secondary | ICD-10-CM | POA: Diagnosis not present

## 2017-05-24 MED ORDER — ESTRADIOL 10 MCG VA TABS: ORAL_TABLET | VAGINAL | 4 refills | Status: DC

## 2017-05-24 NOTE — Progress Notes (Signed)
[name] [name] [birth year] [id number]    History:    Presents for annual exam.  01/2017 TAH with BSO for 6 cm dermoid and fibroids on no HRT. Negative colonoscopy 2018. Normal Pap and mammogram history. Hypertension and hypothyroid.  Past medical history, past surgical history, family history and social history were all reviewed and documented in the EPIC chart.  Completing PhD in physical therapy. Son 68, daughter 55 both doing well. Parents hypertension, mother diabetes deceased this past year after a stroke.  ROS:  A ROS was performed and pertinent positives and negatives are included.  Exam:  Vitals:   05/24/17 1608  BP: 134/80  Weight: 199 lb (90.3 kg)  Height: 5\' 3"  (1.6 m)   Body mass index is 35.25 kg/m.   General appearance:  Normal Thyroid:  Symmetrical, normal in size, without palpable masses or nodularity. Respiratory  Auscultation:  Clear without wheezing or rhonchi Cardiovascular  Auscultation:  Regular rate, without rubs, murmurs or gallops  Edema/varicosities:  Not grossly evident Abdominal  Soft,nontender, without masses, guarding or rebound.  Liver/spleen:  No organomegaly noted  Hernia:  None appreciated  Skin  Inspection:  Grossly normal   Breasts: Examined lying and sitting.     Right: Without masses, retractions, discharge or axillary adenopathy.     Left: Without masses, retractions, discharge or axillary adenopathy. Gentitourinary   Inguinal/mons:  Normal without inguinal adenopathy  External genitalia:  Normal  BUS/Urethra/Skene's glands:  Normal  Vagina:  Normal  Cervix:  And uterus absent  Adnexa/parametria:     Rt: Without masses or tenderness.   Lt: Without masses or tenderness.  Anus and perineum: Normal  Digital rectal exam: Normal sphincter tone without palpated masses or tenderness  Assessment/Plan:  [name] for annual exam complained of dyspareunia.  01/2017 TAH with BSO for fibroids and dermoid Hypertension and  hypothyroidism-primary care manages labs and meds Obesity  Plan: dyspareunia reviewed, will try Vagifem one applicatorl per vagina for 2 weeks and then twice weekly after, prescription, proper use given and reviewed minimal systemic absorption. Having rare hot flushes.SBE's, continue annual screening mammogram, calcium rich diet, vitamin D 2000 daily encouraged.    [name] [facility], 6:19 PM 05/24/2017

## 2017-05-24 NOTE — Patient Instructions (Addendum)
Vit d 2000 daily   health Maintenance for Postmenopausal Women Menopause is a normal process in which your reproductive ability comes to an end. This process happens gradually over a span of months to years, usually between the ages of 55 and 41. Menopause is complete when you have missed 12 consecutive menstrual periods. It is important to talk with your health care provider about some of the most common conditions that affect postmenopausal women, such as heart disease, cancer, and bone loss (osteoporosis). Adopting a healthy lifestyle and getting preventive care can help to promote your health and wellness. Those actions can also lower your chances of developing some of these common conditions. What should I know about menopause? During menopause, you may experience a number of symptoms, such as:  Moderate-to-severe hot flashes.  Night sweats.  Decrease in sex drive.  Mood swings.  Headaches.  Tiredness.  Irritability.  Memory problems.  Insomnia.  Choosing to treat or not to treat menopausal changes is an individual decision that you make with your health care provider. What should I know about hormone replacement therapy and supplements? Hormone therapy products are effective for treating symptoms that are associated with menopause, such as hot flashes and night sweats. Hormone replacement carries certain risks, especially as you become older. If you are thinking about using estrogen or estrogen with progestin treatments, discuss the benefits and risks with your health care provider. What should I know about heart disease and stroke? Heart disease, heart attack, and stroke become more likely as you age. This may be due, in part, to the hormonal changes that your body experiences during menopause. These can affect how your body processes dietary fats, triglycerides, and cholesterol. Heart attack and stroke are both medical emergencies. There are many things that you can do to help  prevent heart disease and stroke:  Have your blood pressure checked at least every 1-2 years. High blood pressure causes heart disease and increases the risk of stroke.  If you are 65-44 years old, ask your health care provider if you should take aspirin to prevent a heart attack or a stroke.  Do not use any tobacco products, including cigarettes, chewing tobacco, or electronic cigarettes. If you need help quitting, ask your health care provider.  It is important to eat a healthy diet and maintain a healthy weight. ? Be sure to include plenty of vegetables, fruits, low-fat dairy products, and lean protein. ? Avoid eating foods that are high in solid fats, added sugars, or salt (sodium).  Get regular exercise. This is one of the most important things that you can do for your health. ? Try to exercise for at least 150 minutes each week. The type of exercise that you do should increase your heart rate and make you sweat. This is known as moderate-intensity exercise. ? Try to do strengthening exercises at least twice each week. Do these in addition to the moderate-intensity exercise.  Know your numbers.Ask your health care provider to check your cholesterol and your blood glucose. Continue to have your blood tested as directed by your health care provider.  What should I know about cancer screening? There are several types of cancer. Take the following steps to reduce your risk and to catch any cancer development as early as possible. Breast Cancer  Practice breast self-awareness. ? This means understanding how your breasts normally appear and feel. ? It also means doing regular breast self-exams. Let your health care provider know about any changes, no matter how  small.  If you are 40 or older, have a clinician do a breast exam (clinical breast exam or CBE) every year. Depending on your age, family history, and medical history, it may be recommended that you also have a yearly breast X-ray  (mammogram).  If you have a family history of breast cancer, talk with your health care provider about genetic screening.  If you are at high risk for breast cancer, talk with your health care provider about having an MRI and a mammogram every year.  Breast cancer (BRCA) gene test is recommended for women who have family members with BRCA-related cancers. Results of the assessment will determine the need for genetic counseling and BRCA1 and for BRCA2 testing. BRCA-related cancers include these types: ? Breast. This occurs in males or females. ? Ovarian. ? Tubal. This may also be called fallopian tube cancer. ? Cancer of the abdominal or pelvic lining (peritoneal cancer). ? Prostate. ? Pancreatic.  Cervical, Uterine, and Ovarian Cancer Your health care provider may recommend that you be screened regularly for cancer of the pelvic organs. These include your ovaries, uterus, and vagina. This screening involves a pelvic exam, which includes checking for microscopic changes to the surface of your cervix (Pap test).  For women ages 21-65, health care providers may recommend a pelvic exam and a Pap test every three years. For women ages 77-65, they may recommend the Pap test and pelvic exam, combined with testing for human papilloma virus (HPV), every five years. Some types of HPV increase your risk of cervical cancer. Testing for HPV may also be done on women of any age who have unclear Pap test results.  Other health care providers may not recommend any screening for nonpregnant women who are considered low risk for pelvic cancer and have no symptoms. Ask your health care provider if a screening pelvic exam is right for you.  If you have had past treatment for cervical cancer or a condition that could lead to cancer, you need Pap tests and screening for cancer for at least 20 years after your treatment. If Pap tests have been discontinued for you, your risk factors (such as having a new sexual  partner) need to be reassessed to determine if you should start having screenings again. Some women have medical problems that increase the chance of getting cervical cancer. In these cases, your health care provider may recommend that you have screening and Pap tests more often.  If you have a family history of uterine cancer or ovarian cancer, talk with your health care provider about genetic screening.  If you have vaginal bleeding after reaching menopause, tell your health care provider.  There are currently no reliable tests available to screen for ovarian cancer.  Lung Cancer Lung cancer screening is recommended for adults 49-59 years old who are at high risk for lung cancer because of a history of smoking. A yearly low-dose CT scan of the lungs is recommended if you:  Currently smoke.  Have a history of at least 30 pack-years of smoking and you currently smoke or have quit within the past 15 years. A pack-year is smoking an average of one pack of cigarettes per day for one year.  Yearly screening should:  Continue until it has been 15 years since you quit.  Stop if you develop a health problem that would prevent you from having lung cancer treatment.  Colorectal Cancer  This type of cancer can be detected and can often be prevented.  Routine  colorectal cancer screening usually begins at age 24 and continues through age 68.  If you have risk factors for colon cancer, your health care provider may recommend that you be screened at an earlier age.  If you have a family history of colorectal cancer, talk with your health care provider about genetic screening.  Your health care provider may also recommend using home test kits to check for hidden blood in your stool.  A small camera at the end of a tube can be used to examine your colon directly (sigmoidoscopy or colonoscopy). This is done to check for the earliest forms of colorectal cancer.  Direct examination of the colon  should be repeated every 5-10 years until age 41. However, if early forms of precancerous polyps or small growths are found or if you have a family history or genetic risk for colorectal cancer, you may need to be screened more often.  Skin Cancer  Check your skin from head to toe regularly.  Monitor any moles. Be sure to tell your health care provider: ? About any new moles or changes in moles, especially if there is a change in a mole's shape or color. ? If you have a mole that is larger than the size of a pencil eraser.  If any of your family members has a history of skin cancer, especially at a Holley Wirt age, talk with your health care provider about genetic screening.  Always use sunscreen. Apply sunscreen liberally and repeatedly throughout the day.  Whenever you are outside, protect yourself by wearing long sleeves, pants, a wide-brimmed hat, and sunglasses.  What should I know about osteoporosis? Osteoporosis is a condition in which bone destruction happens more quickly than new bone creation. After menopause, you may be at an increased risk for osteoporosis. To help prevent osteoporosis or the bone fractures that can happen because of osteoporosis, the following is recommended:  If you are 38-12 years old, get at least 1,000 mg of calcium and at least 600 mg of vitamin D per day.  If you are older than age 51 but younger than age 46, get at least 1,200 mg of calcium and at least 600 mg of vitamin D per day.  If you are older than age 43, get at least 1,200 mg of calcium and at least 800 mg of vitamin D per day.  Smoking and excessive alcohol intake increase the risk of osteoporosis. Eat foods that are rich in calcium and vitamin D, and do weight-bearing exercises several times each week as directed by your health care provider. What should I know about how menopause affects my mental health? Depression may occur at any age, but it is more common as you become older. Common symptoms of  depression include:  Low or sad mood.  Changes in sleep patterns.  Changes in appetite or eating patterns.  Feeling an overall lack of motivation or enjoyment of activities that you previously enjoyed.  Frequent crying spells.  Talk with your health care provider if you think that you are experiencing depression. What should I know about immunizations? It is important that you get and maintain your immunizations. These include:  Tetanus, diphtheria, and pertussis (Tdap) booster vaccine.  Influenza every year before the flu season begins.  Pneumonia vaccine.  Shingles vaccine.  Your health care provider may also recommend other immunizations. This information is not intended to replace advice given to you by your health care provider. Make sure you discuss any questions you have with your  health care provider. Document Released: 04/17/2005 Document Revised: 09/13/2015 Document Reviewed: 11/27/2014 Elsevier Interactive Patient Education  2018 [employer].

## 2017-06-01 ENCOUNTER — Ambulatory Visit: Payer: 59

## 2017-06-01 ENCOUNTER — Telehealth: Payer: Self-pay | Admitting: *Deleted

## 2017-06-01 MED ORDER — NONFORMULARY OR COMPOUNDED ITEM: 3 refills | Status: DC

## 2017-06-01 NOTE — Telephone Encounter (Signed)
Pt aware, Rx called into [facility]

## 2017-06-01 NOTE — Telephone Encounter (Signed)
Please call patient and call in compounded Estradiol .02 vaginal cream, apply 1 applicator twice weekly dispense 1 tube with 4 refills.  Reviewed that should be approximately $30 but should last at least 2-3 months.

## 2017-06-01 NOTE — Telephone Encounter (Signed)
Pt called stating the estradiol vaginal tablets 10 mcg was too expensive with her insurance. Pt asked if you could prescribed something else, if nonething else cheaper she pt said she will use lubrication. Please advise

## 2017-06-02 ENCOUNTER — Ambulatory Visit
Admission: RE | Admit: 2017-06-02 | Discharge: 2017-06-02 | Disposition: A | Discharge status: Home / Self Care | Payer: 59 | Source: Ambulatory Visit | Attending: Women's Health | Admitting: Women's Health

## 2017-06-02 DIAGNOSIS — Z1231 Encounter for screening mammogram for malignant neoplasm of breast: Secondary | ICD-10-CM | POA: Diagnosis not present

## 2017-06-03 ENCOUNTER — Encounter: Payer: Self-pay | Admitting: Women's Health

## 2017-11-15 DIAGNOSIS — R946 Abnormal results of thyroid function studies: Secondary | ICD-10-CM | POA: Diagnosis not present

## 2017-11-15 DIAGNOSIS — E063 Autoimmune thyroiditis: Secondary | ICD-10-CM | POA: Diagnosis not present

## 2017-11-15 DIAGNOSIS — E039 Hypothyroidism, unspecified: Secondary | ICD-10-CM | POA: Diagnosis not present

## 2017-11-29 DIAGNOSIS — E039 Hypothyroidism, unspecified: Secondary | ICD-10-CM | POA: Diagnosis not present

## 2017-11-29 DIAGNOSIS — R946 Abnormal results of thyroid function studies: Secondary | ICD-10-CM | POA: Diagnosis not present

## 2018-01-19 DIAGNOSIS — E0590 Thyrotoxicosis, unspecified without thyrotoxic crisis or storm: Secondary | ICD-10-CM | POA: Diagnosis not present

## 2018-01-19 DIAGNOSIS — Z0000 Encounter for general adult medical examination without abnormal findings: Secondary | ICD-10-CM | POA: Diagnosis not present

## 2018-01-21 DIAGNOSIS — Z0000 Encounter for general adult medical examination without abnormal findings: Secondary | ICD-10-CM | POA: Diagnosis not present

## 2018-01-21 DIAGNOSIS — Z23 Encounter for immunization: Secondary | ICD-10-CM | POA: Diagnosis not present

## 2018-01-21 DIAGNOSIS — G479 Sleep disorder, unspecified: Secondary | ICD-10-CM | POA: Diagnosis not present

## 2018-01-21 DIAGNOSIS — I10 Essential (primary) hypertension: Secondary | ICD-10-CM | POA: Diagnosis not present

## 2018-01-21 DIAGNOSIS — L309 Dermatitis, unspecified: Secondary | ICD-10-CM | POA: Diagnosis not present

## 2018-04-22 ENCOUNTER — Other Ambulatory Visit: Payer: Self-pay | Admitting: Women's Health

## 2018-04-22 DIAGNOSIS — Z1231 Encounter for screening mammogram for malignant neoplasm of breast: Secondary | ICD-10-CM

## 2018-05-31 ENCOUNTER — Encounter: Payer: 59 | Admitting: Women's Health

## 2018-06-01 DIAGNOSIS — E039 Hypothyroidism, unspecified: Secondary | ICD-10-CM | POA: Diagnosis not present

## 2018-06-07 ENCOUNTER — Ambulatory Visit: Payer: 59

## 2018-06-22 ENCOUNTER — Encounter: Payer: 59 | Admitting: Women's Health

## 2018-07-05 ENCOUNTER — Ambulatory Visit: Payer: 59

## 2018-07-13 ENCOUNTER — Ambulatory Visit (INDEPENDENT_AMBULATORY_CARE_PROVIDER_SITE_OTHER): Payer: 59 | Admitting: Women's Health

## 2018-07-13 ENCOUNTER — Other Ambulatory Visit: Payer: Self-pay

## 2018-07-13 ENCOUNTER — Encounter: Payer: Self-pay | Admitting: Women's Health

## 2018-07-13 VITALS — BP 130/80 | Ht 64.0 in | Wt 202.0 lb

## 2018-07-13 DIAGNOSIS — Z01419 Encounter for gynecological examination (general) (routine) without abnormal findings: Secondary | ICD-10-CM | POA: Diagnosis not present

## 2018-07-13 NOTE — Patient Instructions (Signed)
Health Maintenance for Postmenopausal Women Menopause is a normal process in which your reproductive ability comes to an end. This process happens gradually over a span of months to years, usually between the ages of 62 and 89. Menopause is complete when you have missed 12 consecutive menstrual periods. It is important to talk with your health care provider about some of the most common conditions that affect postmenopausal women, such as heart disease, cancer, and bone loss (osteoporosis). Adopting a healthy lifestyle and getting preventive care can help to promote your health and wellness. Those actions can also lower your chances of developing some of these common conditions. What should I know about menopause? During menopause, you may experience a number of symptoms, such as:  Moderate-to-severe hot flashes.  Night sweats.  Decrease in sex drive.  Mood swings.  Headaches.  Tiredness.  Irritability.  Memory problems.  Insomnia. Choosing to treat or not to treat menopausal changes is an individual decision that you make with your health care provider. What should I know about hormone replacement therapy and supplements? Hormone therapy products are effective for treating symptoms that are associated with menopause, such as hot flashes and night sweats. Hormone replacement carries certain risks, especially as you become older. If you are thinking about using estrogen or estrogen with progestin treatments, discuss the benefits and risks with your health care provider. What should I know about heart disease and stroke? Heart disease, heart attack, and stroke become more likely as you age. This may be due, in part, to the hormonal changes that your body experiences during menopause. These can affect how your body processes dietary fats, triglycerides, and cholesterol. Heart attack and stroke are both medical emergencies. There are many things that you can do to help prevent heart disease  and stroke:  Have your blood pressure checked at least every 1-2 years. High blood pressure causes heart disease and increases the risk of stroke.  If you are 79-72 years old, ask your health care provider if you should take aspirin to prevent a heart attack or a stroke.  Do not use any tobacco products, including cigarettes, chewing tobacco, or electronic cigarettes. If you need help quitting, ask your health care provider.  It is important to eat a healthy diet and maintain a healthy weight. ? Be sure to include plenty of vegetables, fruits, low-fat dairy products, and lean protein. ? Avoid eating foods that are high in solid fats, added sugars, or salt (sodium).  Get regular exercise. This is one of the most important things that you can do for your health. ? Try to exercise for at least 150 minutes each week. The type of exercise that you do should increase your heart rate and make you sweat. This is known as moderate-intensity exercise. ? Try to do strengthening exercises at least twice each week. Do these in addition to the moderate-intensity exercise.  Know your numbers.Ask your health care provider to check your cholesterol and your blood glucose. Continue to have your blood tested as directed by your health care provider.  What should I know about cancer screening? There are several types of cancer. Take the following steps to reduce your risk and to catch any cancer development as early as possible. Breast Cancer  Practice breast self-awareness. ? This means understanding how your breasts normally appear and feel. ? It also means doing regular breast self-exams. Let your health care provider know about any changes, no matter how small.  If you are 40 or  older, have a clinician do a breast exam (clinical breast exam or CBE) every year. Depending on your age, family history, and medical history, it may be recommended that you also have a yearly breast X-ray (mammogram).  If you  have a family history of breast cancer, talk with your health care provider about genetic screening.  If you are at high risk for breast cancer, talk with your health care provider about having an MRI and a mammogram every year.  Breast cancer (BRCA) gene test is recommended for women who have family members with BRCA-related cancers. Results of the assessment will determine the need for genetic counseling and BRCA1 and for BRCA2 testing. BRCA-related cancers include these types: ? Breast. This occurs in males or females. ? Ovarian. ? Tubal. This may also be called fallopian tube cancer. ? Cancer of the abdominal or pelvic lining (peritoneal cancer). ? Prostate. ? Pancreatic. Cervical, Uterine, and Ovarian Cancer Your health care provider may recommend that you be screened regularly for cancer of the pelvic organs. These include your ovaries, uterus, and vagina. This screening involves a pelvic exam, which includes checking for microscopic changes to the surface of your cervix (Pap test).  For women ages 21-65, health care providers may recommend a pelvic exam and a Pap test every three years. For women ages 39-65, they may recommend the Pap test and pelvic exam, combined with testing for human papilloma virus (HPV), every five years. Some types of HPV increase your risk of cervical cancer. Testing for HPV may also be done on women of any age who have unclear Pap test results.  Other health care providers may not recommend any screening for nonpregnant women who are considered low risk for pelvic cancer and have no symptoms. Ask your health care provider if a screening pelvic exam is right for you.  If you have had past treatment for cervical cancer or a condition that could lead to cancer, you need Pap tests and screening for cancer for at least 20 years after your treatment. If Pap tests have been discontinued for you, your risk factors (such as having a new sexual partner) need to be reassessed  to determine if you should start having screenings again. Some women have medical problems that increase the chance of getting cervical cancer. In these cases, your health care provider may recommend that you have screening and Pap tests more often.  If you have a family history of uterine cancer or ovarian cancer, talk with your health care provider about genetic screening.  If you have vaginal bleeding after reaching menopause, tell your health care provider.  There are currently no reliable tests available to screen for ovarian cancer. Lung Cancer Lung cancer screening is recommended for adults 57-50 years old who are at high risk for lung cancer because of a history of smoking. A yearly low-dose CT scan of the lungs is recommended if you:  Currently smoke.  Have a history of at least 30 pack-years of smoking and you currently smoke or have quit within the past 15 years. A pack-year is smoking an average of one pack of cigarettes per day for one year. Yearly screening should:  Continue until it has been 15 years since you quit.  Stop if you develop a health problem that would prevent you from having lung cancer treatment. Colorectal Cancer  This type of cancer can be detected and can often be prevented.  Routine colorectal cancer screening usually begins at age 12 and continues through  age 63.  If you have risk factors for colon cancer, your health care provider may recommend that you be screened at an earlier age.  If you have a family history of colorectal cancer, talk with your health care provider about genetic screening.  Your health care provider may also recommend using home test kits to check for hidden blood in your stool.  A small camera at the end of a tube can be used to examine your colon directly (sigmoidoscopy or colonoscopy). This is done to check for the earliest forms of colorectal cancer.  Direct examination of the colon should be repeated every 5-10 years until  age 75. However, if early forms of precancerous polyps or small growths are found or if you have a family history or genetic risk for colorectal cancer, you may need to be screened more often. Skin Cancer  Check your skin from head to toe regularly.  Monitor any moles. Be sure to tell your health care provider: ? About any new moles or changes in moles, especially if there is a change in a mole's shape or color. ? If you have a mole that is larger than the size of a pencil eraser.  If any of your family members has a history of skin cancer, especially at a young age, talk with your health care provider about genetic screening.  Always use sunscreen. Apply sunscreen liberally and repeatedly throughout the day.  Whenever you are outside, protect yourself by wearing long sleeves, pants, a wide-brimmed hat, and sunglasses. What should I know about osteoporosis? Osteoporosis is a condition in which bone destruction happens more quickly than new bone creation. After menopause, you may be at an increased risk for osteoporosis. To help prevent osteoporosis or the bone fractures that can happen because of osteoporosis, the following is recommended:  If you are 59-59 years old, get at least 1,000 mg of calcium and at least 600 mg of vitamin D per day.  If you are older than age 36 but younger than age 32, get at least 1,200 mg of calcium and at least 600 mg of vitamin D per day.  If you are older than age 47, get at least 1,200 mg of calcium and at least 800 mg of vitamin D per day. Smoking and excessive alcohol intake increase the risk of osteoporosis. Eat foods that are rich in calcium and vitamin D, and do weight-bearing exercises several times each week as directed by your health care provider. What should I know about how menopause affects my mental health? Depression may occur at any age, but it is more common as you become older. Common symptoms of depression include:  Low or sad mood.   Changes in sleep patterns.  Changes in appetite or eating patterns.  Feeling an overall lack of motivation or enjoyment of activities that you previously enjoyed.  Frequent crying spells. Talk with your health care provider if you think that you are experiencing depression. What should I know about immunizations? It is important that you get and maintain your immunizations. These include:  Tetanus, diphtheria, and pertussis (Tdap) booster vaccine.  Influenza every year before the flu season begins.  Pneumonia vaccine.  Shingles vaccine. Your health care provider may also recommend other immunizations. This information is not intended to replace advice given to you by your health care provider. Make sure you discuss any questions you have with your health care provider. Document Released: 04/17/2005 Document Revised: 09/13/2015 Document Reviewed: 11/27/2014 Elsevier Interactive Patient Education  2019 [place].

## 2018-07-13 NOTE — Progress Notes (Signed)
[name] [name] 1965/07/09 [id number]    History:    Presents for annual exam.  Jun 25, 2016 TAH with BSO for 6 cm dermoid and fibroids on no HRT.  Primary care manages hypertension.  Hashimoto thyroiditis/hypothyroidism endocrinologist managing.  Normal mammogram history.  06/25/16- colonoscopy.  Past medical history, past surgical history, family history and social history were all reviewed and documented in the EPIC chart.  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  Parents hypertension, mother diabetes deceased from stroke in June 25, 2017.  Son 58, daughter 66 both doing well.  ROS:  A ROS was performed and pertinent positives and negatives are included.  Exam:  Vitals:   07/13/18 1306  BP: 130/80  Weight: 202 lb (91.6 kg)  Height: 5\' 4"  (1.626 m)   Body mass index is 34.67 kg/m.   General appearance:  Normal Thyroid:  Symmetrical, normal in size, without palpable masses or nodularity. Respiratory  Auscultation:  Clear without wheezing or rhonchi Cardiovascular  Auscultation:  Regular rate, without rubs, murmurs or gallops  Edema/varicosities:  Not grossly evident Abdominal  Soft,nontender, without masses, guarding or rebound.  Liver/spleen:  No organomegaly noted  Hernia:  None appreciated  Skin  Inspection:  Grossly normal   Breasts: Examined lying and sitting.     Right: Without masses, retractions, discharge or axillary adenopathy.     Left: Without masses, retractions, discharge or axillary adenopathy. Gentitourinary   Inguinal/mons:  Normal without inguinal adenopathy  External genitalia:  Normal  BUS/Urethra/Skene's glands:  Normal  Vagina:  Normal  Cervix: And uterus absent  Adnexa/parametria:     Rt: Without masses or tenderness.   Lt: Without masses or tenderness.  Anus and perineum: Normal  Digital rectal exam: Normal sphincter tone without palpated masses or tenderness  Assessment/Plan:  53 y.o. MBF G3 P2 for annual exam with no complaints.  Jun 25, 2016 TAH with  BSO on no HRT Hashimoto thyroiditis-endocrinologist managing Hypertension primary care manages labs and meds Obesity  Plan: SBEs, continue annual screening mammogram, calcium rich foods, vitamin D 2000 daily encouraged.  Reviewed importance of increasing regular cardio and balance type exercise, decreasing calorie/carbs.  Congratulations given on recent completion of PhD.  Pap screening guidelines reviewed.  [name] [facility], 1:10 PM 07/13/2018

## 2018-09-01 ENCOUNTER — Ambulatory Visit
Admission: RE | Admit: 2018-09-01 | Discharge: 2018-09-01 | Disposition: A | Discharge status: Home / Self Care | Payer: 59 | Source: Ambulatory Visit | Attending: Women's Health | Admitting: Women's Health

## 2018-09-01 ENCOUNTER — Other Ambulatory Visit: Payer: Self-pay

## 2018-09-01 DIAGNOSIS — Z1231 Encounter for screening mammogram for malignant neoplasm of breast: Secondary | ICD-10-CM

## 2018-11-30 ENCOUNTER — Encounter: Payer: Self-pay | Admitting: Gynecology

## 2019-07-18 ENCOUNTER — Other Ambulatory Visit: Payer: Self-pay

## 2019-07-19 ENCOUNTER — Ambulatory Visit (INDEPENDENT_AMBULATORY_CARE_PROVIDER_SITE_OTHER): Payer: 59 | Admitting: Nurse Practitioner

## 2019-07-19 ENCOUNTER — Encounter: Payer: Self-pay | Admitting: Nurse Practitioner

## 2019-07-19 VITALS — BP 118/80 | Ht 64.0 in | Wt 202.0 lb

## 2019-07-19 DIAGNOSIS — Z13220 Encounter for screening for lipoid disorders: Secondary | ICD-10-CM

## 2019-07-19 DIAGNOSIS — Z01419 Encounter for gynecological examination (general) (routine) without abnormal findings: Secondary | ICD-10-CM | POA: Diagnosis not present

## 2019-07-19 DIAGNOSIS — Z90722 Acquired absence of ovaries, bilateral: Secondary | ICD-10-CM

## 2019-07-19 DIAGNOSIS — Z9079 Acquired absence of other genital organ(s): Secondary | ICD-10-CM

## 2019-07-19 DIAGNOSIS — Z90710 Acquired absence of both cervix and uterus: Secondary | ICD-10-CM | POA: Diagnosis not present

## 2019-07-19 NOTE — Patient Instructions (Addendum)
Start Colace daily for constipation Colonoscopy due in 2023  Health Maintenance, Female Adopting a healthy lifestyle and getting preventive care are important in promoting health and wellness. Ask your health care provider about:  The right schedule for you to have regular tests and exams.  Things you can do on your own to prevent diseases and keep yourself healthy. What should I know about diet, weight, and exercise? Eat a healthy diet   Eat a diet that includes plenty of vegetables, fruits, low-fat dairy products, and lean protein.  Do not eat a lot of foods that are high in solid fats, added sugars, or sodium. Maintain a healthy weight Body mass index (BMI) is used to identify weight problems. It estimates body fat based on height and weight. Your health care provider can help determine your BMI and help you achieve or maintain a healthy weight. Get regular exercise Get regular exercise. This is one of the most important things you can do for your health. Most adults should:  Exercise for at least 150 minutes each week. The exercise should increase your heart rate and make you sweat (moderate-intensity exercise).  Do strengthening exercises at least twice a week. This is in addition to the moderate-intensity exercise.  Spend less time sitting. Even light physical activity can be beneficial. Watch cholesterol and blood lipids Have your blood tested for lipids and cholesterol at 53 years of age, then have this test every 5 years. Have your cholesterol levels checked more often if:  Your lipid or cholesterol levels are high.  You are older than 53 years of age.  You are at high risk for heart disease. What should I know about cancer screening? Depending on your health history and family history, you may need to have cancer screening at various ages. This may include screening for:  Breast cancer.  Cervical cancer.  Colorectal cancer.  Skin cancer.  Lung cancer. What  should I know about heart disease, diabetes, and high blood pressure? Blood pressure and heart disease  High blood pressure causes heart disease and increases the risk of stroke. This is more likely to develop in people who have high blood pressure readings, are of African descent, or are overweight.  Have your blood pressure checked: ? Every 3-5 years if you are 37-16 years of age. ? Every year if you are 66 years old or older. Diabetes Have regular diabetes screenings. This checks your fasting blood sugar level. Have the screening done:  Once every three years after age 72 if you are at a normal weight and have a low risk for diabetes.  More often and at a younger age if you are overweight or have a high risk for diabetes. What should I know about preventing infection? Hepatitis B If you have a higher risk for hepatitis B, you should be screened for this virus. Talk with your health care provider to find out if you are at risk for hepatitis B infection. Hepatitis C Testing is recommended for:  Everyone born from [birth year] through 1964.  Anyone with known risk factors for hepatitis C. Sexually transmitted infections (STIs)  Get screened for STIs, including gonorrhea and chlamydia, if: ? You are sexually active and are younger than 53 years of age. ? You are older than 54 years of age and your health care provider tells you that you are at risk for this type of infection. ? Your sexual activity has changed since you were last screened, and you are at increased risk  for chlamydia or gonorrhea. Ask your health care provider if you are at risk.  Ask your health care provider about whether you are at high risk for HIV. Your health care provider may recommend a prescription medicine to help prevent HIV infection. If you choose to take medicine to prevent HIV, you should first get tested for HIV. You should then be tested every 3 months for as long as you are taking the medicine. Pregnancy  If  you are about to stop having your period (premenopausal) and you may become pregnant, seek counseling before you get pregnant.  Take 400 to 800 micrograms (mcg) of folic acid every day if you become pregnant.  Ask for birth control (contraception) if you want to prevent pregnancy. Osteoporosis and menopause Osteoporosis is a disease in which the bones lose minerals and strength with aging. This can result in bone fractures. If you are 9 years old or older, or if you are at risk for osteoporosis and fractures, ask your health care provider if you should:  Be screened for bone loss.  Take a calcium or vitamin D supplement to lower your risk of fractures.  Be given hormone replacement therapy (HRT) to treat symptoms of menopause. Follow these instructions at home: Lifestyle  Do not use any products that contain nicotine or tobacco, such as cigarettes, e-cigarettes, and chewing tobacco. If you need help quitting, ask your health care provider.  Do not use street drugs.  Do not share needles.  Ask your health care provider for help if you need support or information about quitting drugs. Alcohol use  Do not drink alcohol if: ? Your health care provider tells you not to drink. ? You are pregnant, may be pregnant, or are planning to become pregnant.  If you drink alcohol: ? Limit how much you use to 0-1 drink a day. ? Limit intake if you are breastfeeding.  Be aware of how much alcohol is in your drink. In the [place] one drink equals one 12 oz bottle of beer (355 mL), one 5 oz glass of wine (148 mL), or one 1 oz glass of hard liquor (44 mL). General instructions  Schedule regular health, dental, and eye exams.  Stay current with your vaccines.  Tell your health care provider if: ? You often feel depressed. ? You have ever been abused or do not feel safe at home. Summary  Adopting a healthy lifestyle and getting preventive care are important in promoting health and  wellness.  Follow your health care provider's instructions about healthy diet, exercising, and getting tested or screened for diseases.  Follow your health care provider's instructions on monitoring your cholesterol and blood pressure. This information is not intended to replace advice given to you by your health care provider. Make sure you discuss any questions you have with your health care provider. Document Revised: 02/16/2018 Document Reviewed: 02/16/2018 Elsevier Patient Education  2020 [employer].

## 2019-07-19 NOTE — Progress Notes (Signed)
   [name] [name] [birth year] [id number]   History:  54 y.o. MBFG3 P2 presents for annual exam.  TAH with BSO in May 31, 2016 for 6 cm dermoid and fibroids - no HRT.  Complains of infrequent bowel movements that are hard, has tried herbal medications for this with minimal relief. History of hypertension managed by primary, Hashimoto thyroiditis/hypothyroidism managed by endocrinology.  Normal mammogram history.  Grandmother and sister with history of breast cancer.  Gynecologic History Patient's last menstrual period was 01/27/2015.   Contraception: status post hysterectomy Last Pap: 03/27/16. Results were: +ASCUS negative HR HPV Last mammogram: 09/01/18. Results were: normal Colonoscopy: 01/21/17. Results: normal. Recommended repeat in 3 years to due incomplete visualization  Past medical history, past surgical history, family history and social history were all reviewed and documented in the EPIC chart.  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  Son 77, daughter 45.  Mother had diabetes, deceased from stroke in 2017/05/31.  ROS:  A ROS was performed and pertinent positives and negatives are included.  Exam:  Vitals:   07/19/19 0840  Weight: 202 lb (91.6 kg)  Height: 5\' 4"  (1.626 m)   Body mass index is 34.67 kg/m.  General appearance:  Normal Thyroid:  Symmetrical, normal in size, without palpable masses or nodularity. Respiratory  Auscultation:  Clear without wheezing or rhonchi Cardiovascular  Auscultation:  Regular rate, without rubs, murmurs or gallops  Edema/varicosities:  Not grossly evident Abdominal  Soft,nontender, without masses, guarding or rebound.  Liver/spleen:  No organomegaly noted  Hernia:  None appreciated  Skin  Inspection:  Grossly normal   Breasts: Examined lying and sitting.   Right: Without masses, retractions, discharge or axillary adenopathy.   Left: Without masses, retractions, discharge or axillary adenopathy. Gentitourinary   Inguinal/mons:  Normal  without inguinal adenopathy  External genitalia:  Normal  BUS/Urethra/Skene's glands:  Normal  Vagina:  Normal, mild atrophic changes  Cervix:  absent  Uterus:  absent  Adnexa/parametria:     Rt: Without masses or tenderness.   Lt: Without masses or tenderness.  Anus and perineum: Normal  Digital rectal exam: Normal sphincter tone without palpated masses or tenderness  Assessment/Plan:  54 y.o.  for annual exam.     Well female exam with routine gynecological exam - Plan: CBC with Differential/Platelet, Comprehensive metabolic panel. Education provided on SBEs, importance of preventative screenings, current guidelines, high calcium diet, regular exercise, and multivitamin daily.   H/O total hysterectomy with bilateral salpingo-oophorectomy (BSO) - no HRT  Lipid screening - Plan: Lipid panel  Constipation - Colace 100 mg daily. May increase to 200 mg daily if needed  Follow up in 1 year for annual     [place], 8:40 AM 07/19/2019

## 2019-07-20 LAB — CBC WITH DIFFERENTIAL/PLATELET
Absolute Monocytes: 378 cells/uL (ref 200–950)
Basophils Absolute: 21 cells/uL (ref 0–200)
Basophils Relative: 0.5 %
Eosinophils Absolute: 71 cells/uL (ref 15–500)
Eosinophils Relative: 1.7 %
HCT: 34.9 % — ABNORMAL LOW (ref 35.0–45.0)
Hemoglobin: 11.6 g/dL — ABNORMAL LOW (ref 11.7–15.5)
Lymphs Abs: 1285 cells/uL (ref 850–3900)
MCH: 30.4 pg (ref 27.0–33.0)
MCHC: 33.2 g/dL (ref 32.0–36.0)
MCV: 91.6 fL (ref 80.0–100.0)
MPV: 13.1 fL — ABNORMAL HIGH (ref 7.5–12.5)
Monocytes Relative: 9 %
Neutro Abs: 2444 cells/uL (ref 1500–7800)
Neutrophils Relative %: 58.2 %
Platelets: 192 10*3/uL (ref 140–400)
RBC: 3.81 10*6/uL (ref 3.80–5.10)
RDW: 13.4 % (ref 11.0–15.0)
Total Lymphocyte: 30.6 %
WBC: 4.2 10*3/uL (ref 3.8–10.8)

## 2019-07-20 LAB — LIPID PANEL
Cholesterol: 164 mg/dL (ref <200)
HDL: 42 mg/dL — ABNORMAL LOW (ref >=50)
LDL Cholesterol (Calc): 101 mg/dL (calc) — ABNORMAL HIGH
Non-HDL Cholesterol (Calc): 122 mg/dL (calc) (ref <130)
Total CHOL/HDL Ratio: 3.9 (calc) (ref <5.0)
Triglycerides: 116 mg/dL (ref <150)

## 2019-07-20 LAB — COMPREHENSIVE METABOLIC PANEL
AG Ratio: 1.2 (calc) (ref 1.0–2.5)
ALT: 25 U/L (ref 6–29)
AST: 18 U/L (ref 10–35)
Albumin: 4.1 g/dL (ref 3.6–5.1)
Alkaline phosphatase (APISO): 59 U/L (ref 37–153)
BUN: 18 mg/dL (ref 7–25)
CO2: 26 mmol/L (ref 20–32)
Calcium: 9.3 mg/dL (ref 8.6–10.4)
Chloride: 107 mmol/L (ref 98–110)
Creat: 0.97 mg/dL (ref 0.50–1.05)
Globulin: 3.5 g/dL (calc) (ref 1.9–3.7)
Glucose, Bld: 108 mg/dL — ABNORMAL HIGH (ref 65–99)
Potassium: 3.7 mmol/L (ref 3.5–5.3)
Sodium: 141 mmol/L (ref 135–146)
Total Bilirubin: 0.4 mg/dL (ref 0.2–1.2)
Total Protein: 7.6 g/dL (ref 6.1–8.1)

## 2019-08-24 ENCOUNTER — Other Ambulatory Visit: Payer: Self-pay | Admitting: Women's Health

## 2019-08-24 ENCOUNTER — Other Ambulatory Visit: Payer: Self-pay | Admitting: Family Medicine

## 2019-08-24 DIAGNOSIS — Z1231 Encounter for screening mammogram for malignant neoplasm of breast: Secondary | ICD-10-CM

## 2019-09-21 ENCOUNTER — Other Ambulatory Visit: Payer: Self-pay

## 2019-09-21 ENCOUNTER — Ambulatory Visit
Admission: RE | Admit: 2019-09-21 | Discharge: 2019-09-21 | Disposition: A | Discharge status: Home / Self Care | Payer: 59 | Source: Ambulatory Visit | Attending: Family Medicine | Admitting: Family Medicine

## 2019-09-21 DIAGNOSIS — Z1231 Encounter for screening mammogram for malignant neoplasm of breast: Secondary | ICD-10-CM

## 2019-12-20 ENCOUNTER — Other Ambulatory Visit: Payer: Self-pay | Admitting: Gastroenterology

## 2019-12-20 DIAGNOSIS — R1011 Right upper quadrant pain: Secondary | ICD-10-CM

## 2019-12-21 ENCOUNTER — Other Ambulatory Visit: Payer: Self-pay | Admitting: Gastroenterology

## 2019-12-21 DIAGNOSIS — R110 Nausea: Secondary | ICD-10-CM

## 2019-12-21 DIAGNOSIS — R1011 Right upper quadrant pain: Secondary | ICD-10-CM

## 2019-12-27 ENCOUNTER — Ambulatory Visit
Admission: RE | Admit: 2019-12-27 | Discharge: 2019-12-27 | Disposition: A | Discharge status: Home / Self Care | Payer: 59 | Source: Ambulatory Visit | Attending: Gastroenterology | Admitting: Gastroenterology

## 2019-12-27 DIAGNOSIS — R1011 Right upper quadrant pain: Secondary | ICD-10-CM

## 2019-12-27 DIAGNOSIS — R110 Nausea: Secondary | ICD-10-CM

## 2020-07-24 ENCOUNTER — Encounter: Payer: Self-pay | Admitting: Nurse Practitioner

## 2020-08-20 ENCOUNTER — Other Ambulatory Visit (HOSPITAL_COMMUNITY)
Admission: RE | Admit: 2020-08-20 | Discharge: 2020-08-20 | Disposition: A | Discharge status: Home / Self Care | Payer: 59 | Source: Ambulatory Visit | Attending: Nurse Practitioner | Admitting: Nurse Practitioner

## 2020-08-20 ENCOUNTER — Other Ambulatory Visit: Payer: Self-pay

## 2020-08-20 ENCOUNTER — Encounter: Payer: Self-pay | Admitting: Nurse Practitioner

## 2020-08-20 ENCOUNTER — Ambulatory Visit (INDEPENDENT_AMBULATORY_CARE_PROVIDER_SITE_OTHER): Payer: BC Managed Care – PPO | Admitting: Nurse Practitioner

## 2020-08-20 VITALS — BP 130/80 | Ht 64.0 in | Wt 203.0 lb

## 2020-08-20 DIAGNOSIS — Z780 Asymptomatic menopausal state: Secondary | ICD-10-CM | POA: Diagnosis not present

## 2020-08-20 DIAGNOSIS — Z90710 Acquired absence of both cervix and uterus: Secondary | ICD-10-CM | POA: Diagnosis not present

## 2020-08-20 DIAGNOSIS — Z01419 Encounter for gynecological examination (general) (routine) without abnormal findings: Secondary | ICD-10-CM

## 2020-08-20 DIAGNOSIS — Z9079 Acquired absence of other genital organ(s): Secondary | ICD-10-CM

## 2020-08-20 DIAGNOSIS — Z90722 Acquired absence of ovaries, bilateral: Secondary | ICD-10-CM

## 2020-08-20 NOTE — Progress Notes (Signed)
   Jenissa Black-Singletary 1965/10/10 [id number]   History:  55 y.o. [id number] presents for annual exam without GYN complaints. 2018 TAH BSO for 6 cm dermoid cyst and fibroids, no HRT. Normal pap and mammogram history. HTN managed by PCP, hypoythyroidism managed by endocrinology.   Gynecologic History Patient's last menstrual period was 01/27/2015.   Contraception/Family planning: status post hysterectomy  Health Maintenance Last Pap: 03/27/2016. Results were: ASCUS negative HPV Last mammogram: 09/21/2019. Results were: Normal Last colonoscopy: 2018. Results were: 3-year recall d/t difficult visualization Last Dexa: Not indicated  Past medical history, past surgical history, family history and social history were all reviewed and documented in the EPIC chart. 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 49 yo son in [place], has young son. 1 yo daughter.   ROS:  A ROS was performed and pertinent positives and negatives are included.  Exam:  Vitals:   08/20/20 0910  BP: 130/80  Weight: 203 lb (92.1 kg)  Height: 5\' 4"  (1.626 m)   Body mass index is 34.84 kg/m.  General appearance:  Normal Thyroid:  Symmetrical, normal in size, without palpable masses or nodularity. Respiratory  Auscultation:  Clear without wheezing or rhonchi Cardiovascular  Auscultation:  Regular rate, without rubs, murmurs or gallops  Edema/varicosities:  Not grossly evident Abdominal  Soft,nontender, without masses, guarding or rebound.  Liver/spleen:  No organomegaly noted  Hernia:  None appreciated  Skin  Inspection:  Grossly normal Breasts: Examined lying and sitting.   Right: Without masses, retractions, nipple discharge or axillary adenopathy.   Left: Without masses, retractions, nipple discharge or axillary adenopathy. Genitourinary   Inguinal/mons:  Normal without inguinal adenopathy  External genitalia:  Normal appearing vulva with no masses, tenderness, or lesions  BUS/Urethra/Skene's glands:  Normal  Vagina:   Normal appearing with normal color and discharge, no lesions  Cervix:  Absent  Uterus:  Absent  Anus and perineum: Normal  Digital rectal exam: Normal sphincter tone without palpated masses or tenderness  Patient informed chaperone available to be present for breast and pelvic exam. Patient has requested no chaperone to be present. Patient has been advised what will be completed during breast and pelvic exam.   Assessment/Plan:  55 y.o. [id number] for annual exam.   Well female exam with routine gynecological exam - Plan: Cytology - PAP( [place]). Education provided on SBEs, importance of preventative screenings, current guidelines, high calcium diet, regular exercise, and multivitamin daily. Labs does elsewhere.   History of total abdominal hysterectomy and bilateral salpingo-oophorectomy - 2018 for 6 cm dermoid cyst and fibroids.   Postmenopausal - no HRT. Denies menopausal symptoms.   Screening for cervical cancer - Pap 03/2016 ASCUS negative HPV, otherwise no significant abnormal pap history. Pap with reflex of vagina today and if normal we will stop screenings. Patient is agreeable.   Screening for breast cancer - Normal mammogram history.  Continue annual screenings.  Normal breast exam today.  Screening for colon cancer - 2018 colonoscopy. 3-year repeat recommended due to difficult visualization. Recommend she call [facility] to schedule this.   Return in 1 year for annual.    Tamela Gammon DNP, 9:26 AM 08/20/2020

## 2020-08-21 LAB — CYTOLOGY - PAP: Diagnosis: NEGATIVE

## 2020-09-16 ENCOUNTER — Other Ambulatory Visit: Payer: Self-pay | Admitting: Family Medicine

## 2020-09-16 DIAGNOSIS — Z1231 Encounter for screening mammogram for malignant neoplasm of breast: Secondary | ICD-10-CM

## 2020-10-04 DIAGNOSIS — R1011 Right upper quadrant pain: Secondary | ICD-10-CM | POA: Diagnosis not present

## 2020-10-04 DIAGNOSIS — Z13220 Encounter for screening for lipoid disorders: Secondary | ICD-10-CM | POA: Diagnosis not present

## 2020-10-04 DIAGNOSIS — E063 Autoimmune thyroiditis: Secondary | ICD-10-CM | POA: Diagnosis not present

## 2020-10-04 DIAGNOSIS — Z0000 Encounter for general adult medical examination without abnormal findings: Secondary | ICD-10-CM | POA: Diagnosis not present

## 2020-10-04 DIAGNOSIS — M545 Low back pain, unspecified: Secondary | ICD-10-CM | POA: Diagnosis not present

## 2020-10-10 DIAGNOSIS — Z23 Encounter for immunization: Secondary | ICD-10-CM | POA: Diagnosis not present

## 2020-10-10 DIAGNOSIS — Z0000 Encounter for general adult medical examination without abnormal findings: Secondary | ICD-10-CM | POA: Diagnosis not present

## 2020-11-12 ENCOUNTER — Ambulatory Visit
Admission: RE | Admit: 2020-11-12 | Discharge: 2020-11-12 | Disposition: A | Discharge status: Home / Self Care | Payer: BC Managed Care – PPO | Source: Ambulatory Visit | Attending: Family Medicine | Admitting: Family Medicine

## 2020-11-12 ENCOUNTER — Other Ambulatory Visit: Payer: Self-pay

## 2020-11-12 DIAGNOSIS — Z1231 Encounter for screening mammogram for malignant neoplasm of breast: Secondary | ICD-10-CM

## 2021-05-14 DIAGNOSIS — J019 Acute sinusitis, unspecified: Secondary | ICD-10-CM | POA: Diagnosis not present

## 2021-08-21 ENCOUNTER — Ambulatory Visit: Payer: BC Managed Care – PPO | Admitting: Nurse Practitioner

## 2021-09-05 ENCOUNTER — Encounter: Payer: Self-pay | Admitting: Nurse Practitioner

## 2021-09-05 ENCOUNTER — Ambulatory Visit (INDEPENDENT_AMBULATORY_CARE_PROVIDER_SITE_OTHER): Payer: BC Managed Care – PPO | Admitting: Nurse Practitioner

## 2021-09-05 VITALS — BP 120/76 | Ht 63.5 in | Wt 205.0 lb

## 2021-09-05 DIAGNOSIS — Z01419 Encounter for gynecological examination (general) (routine) without abnormal findings: Secondary | ICD-10-CM

## 2021-09-05 DIAGNOSIS — Z780 Asymptomatic menopausal state: Secondary | ICD-10-CM | POA: Diagnosis not present

## 2021-09-05 NOTE — Progress Notes (Signed)
   [name] [name] 1965/11/19 [id number]   History:  56 y.o. [id number] presents for annual exam without GYN complaints. 2018 TAH BSO for 6 cm dermoid cyst and fibroids, no HRT. Normal pap and mammogram history. HTN managed by PCP, hypoythyroidism managed by endocrinology.   Gynecologic History Patient's last menstrual period was 01/27/2015.   Contraception/Family planning: status post hysterectomy Sexually active: Yes  Health Maintenance Last Pap: 08/20/2020. Results were: Normal Last mammogram: 11/12/2020. Results were: Normal Last colonoscopy: 01/21/2017. Results were: 3-year recall d/t difficult visualization Last Dexa: Not indicated  Past medical history, past surgical history, family history and social history were all reviewed and documented in the EPIC chart. Married. PT at [facility]. 68 yo son in [place], has 70 month old and 55 yo, engaged. 81 yo daughter.   ROS:  A ROS was performed and pertinent positives and negatives are included.  Exam:  Vitals:   09/05/21 1007  BP: 120/76  Weight: 205 lb (93 kg)  Height: 5' 3.5" (1.613 m)    Body mass index is 35.74 kg/m.  General appearance:  Normal Thyroid:  Symmetrical, normal in size, without palpable masses or nodularity. Respiratory  Auscultation:  Clear without wheezing or rhonchi Cardiovascular  Auscultation:  Regular rate, without rubs, murmurs or gallops  Edema/varicosities:  Not grossly evident Abdominal  Soft,nontender, without masses, guarding or rebound.  Liver/spleen:  No organomegaly noted  Hernia:  None appreciated  Skin  Inspection:  Grossly normal Breasts: Examined lying and sitting.   Right: Without masses, retractions, nipple discharge or axillary adenopathy.   Left: Without masses, retractions, nipple discharge or axillary adenopathy. Genitourinary   Inguinal/mons:  Normal without inguinal adenopathy  External genitalia:  Normal appearing vulva with no masses, tenderness, or  lesions  BUS/Urethra/Skene's glands:  Normal  Vagina:  Normal appearing with normal color and discharge, no lesions  Cervix:  Absent  Uterus:  Absent  Anus and perineum: Normal  Digital rectal exam: Normal sphincter tone without palpated masses or tenderness  Patient informed chaperone available to be present for breast and pelvic exam. Patient has requested no chaperone to be present. Patient has been advised what will be completed during breast and pelvic exam.   Assessment/Plan:  56 y.o. [id number] for annual exam.   Well female exam with routine gynecological exam - Education provided on SBEs, importance of preventative screenings, current guidelines, high calcium diet, regular exercise, and multivitamin daily. Labs done elsewhere.   Postmenopausal - no HRT. S/P 2018 TAH BSO for 6 cm dermoid cyst and fibroids  Screening for cervical cancer - Pap 03/2016 ASCUS negative HPV, otherwise no significant abnormal pap history. Pap 2022 normal. Screenings no longer recommended per guidelines and patient is agreeable.   Screening for breast cancer - Normal mammogram history.  Continue annual screenings.  Normal breast exam today.  Screening for colon cancer - 2018 colonoscopy. 3-year repeat recommended due to difficult visualization. Recommend she call [facility] to schedule this.   Return in 1 year for annual.    Tamela Gammon DNP, 10:27 AM 09/05/2021

## 2021-09-24 DIAGNOSIS — K5900 Constipation, unspecified: Secondary | ICD-10-CM | POA: Diagnosis not present

## 2021-09-24 DIAGNOSIS — R7303 Prediabetes: Secondary | ICD-10-CM | POA: Diagnosis not present

## 2021-09-24 DIAGNOSIS — R0602 Shortness of breath: Secondary | ICD-10-CM | POA: Diagnosis not present

## 2021-09-24 DIAGNOSIS — I10 Essential (primary) hypertension: Secondary | ICD-10-CM | POA: Diagnosis not present

## 2021-09-24 DIAGNOSIS — R948 Abnormal results of function studies of other organs and systems: Secondary | ICD-10-CM | POA: Diagnosis not present

## 2021-09-24 DIAGNOSIS — R5383 Other fatigue: Secondary | ICD-10-CM | POA: Diagnosis not present

## 2021-09-24 DIAGNOSIS — E039 Hypothyroidism, unspecified: Secondary | ICD-10-CM | POA: Diagnosis not present

## 2021-09-24 DIAGNOSIS — E7800 Pure hypercholesterolemia, unspecified: Secondary | ICD-10-CM | POA: Diagnosis not present

## 2021-09-24 DIAGNOSIS — D508 Other iron deficiency anemias: Secondary | ICD-10-CM | POA: Diagnosis not present

## 2021-10-15 ENCOUNTER — Other Ambulatory Visit: Payer: Self-pay | Admitting: Family Medicine

## 2021-10-15 DIAGNOSIS — I10 Essential (primary) hypertension: Secondary | ICD-10-CM | POA: Diagnosis not present

## 2021-10-15 DIAGNOSIS — Z1231 Encounter for screening mammogram for malignant neoplasm of breast: Secondary | ICD-10-CM

## 2021-10-15 DIAGNOSIS — Z9189 Other specified personal risk factors, not elsewhere classified: Secondary | ICD-10-CM | POA: Diagnosis not present

## 2021-10-15 DIAGNOSIS — E039 Hypothyroidism, unspecified: Secondary | ICD-10-CM | POA: Diagnosis not present

## 2021-10-15 DIAGNOSIS — R7303 Prediabetes: Secondary | ICD-10-CM | POA: Diagnosis not present

## 2021-10-15 DIAGNOSIS — E7800 Pure hypercholesterolemia, unspecified: Secondary | ICD-10-CM | POA: Diagnosis not present

## 2021-11-18 DIAGNOSIS — I10 Essential (primary) hypertension: Secondary | ICD-10-CM | POA: Diagnosis not present

## 2021-11-18 DIAGNOSIS — Z23 Encounter for immunization: Secondary | ICD-10-CM | POA: Diagnosis not present

## 2021-11-18 DIAGNOSIS — Z0000 Encounter for general adult medical examination without abnormal findings: Secondary | ICD-10-CM | POA: Diagnosis not present

## 2021-11-18 DIAGNOSIS — R7303 Prediabetes: Secondary | ICD-10-CM | POA: Diagnosis not present

## 2021-11-18 DIAGNOSIS — L218 Other seborrheic dermatitis: Secondary | ICD-10-CM | POA: Diagnosis not present

## 2021-11-18 DIAGNOSIS — E039 Hypothyroidism, unspecified: Secondary | ICD-10-CM | POA: Diagnosis not present

## 2021-11-27 ENCOUNTER — Ambulatory Visit: Payer: BC Managed Care – PPO

## 2021-11-28 ENCOUNTER — Ambulatory Visit
Admission: RE | Admit: 2021-11-28 | Discharge: 2021-11-28 | Disposition: A | Discharge status: Home / Self Care | Payer: BC Managed Care – PPO | Source: Ambulatory Visit | Attending: Family Medicine | Admitting: Family Medicine

## 2021-11-28 DIAGNOSIS — Z1231 Encounter for screening mammogram for malignant neoplasm of breast: Secondary | ICD-10-CM

## 2021-12-01 ENCOUNTER — Other Ambulatory Visit: Payer: Self-pay | Admitting: Family Medicine

## 2021-12-01 DIAGNOSIS — R928 Other abnormal and inconclusive findings on diagnostic imaging of breast: Secondary | ICD-10-CM

## 2021-12-09 ENCOUNTER — Ambulatory Visit
Admission: RE | Admit: 2021-12-09 | Discharge: 2021-12-09 | Disposition: A | Discharge status: Home / Self Care | Payer: BC Managed Care – PPO | Source: Ambulatory Visit | Attending: Family Medicine | Admitting: Family Medicine

## 2021-12-09 DIAGNOSIS — R928 Other abnormal and inconclusive findings on diagnostic imaging of breast: Secondary | ICD-10-CM

## 2021-12-09 DIAGNOSIS — R922 Inconclusive mammogram: Secondary | ICD-10-CM | POA: Diagnosis not present

## 2021-12-18 DIAGNOSIS — E039 Hypothyroidism, unspecified: Secondary | ICD-10-CM | POA: Diagnosis not present

## 2021-12-18 DIAGNOSIS — I10 Essential (primary) hypertension: Secondary | ICD-10-CM | POA: Diagnosis not present

## 2021-12-18 DIAGNOSIS — R7303 Prediabetes: Secondary | ICD-10-CM | POA: Diagnosis not present

## 2021-12-18 DIAGNOSIS — E7800 Pure hypercholesterolemia, unspecified: Secondary | ICD-10-CM | POA: Diagnosis not present

## 2021-12-18 DIAGNOSIS — Z9189 Other specified personal risk factors, not elsewhere classified: Secondary | ICD-10-CM | POA: Diagnosis not present

## 2021-12-24 DIAGNOSIS — K8020 Calculus of gallbladder without cholecystitis without obstruction: Secondary | ICD-10-CM | POA: Diagnosis not present

## 2022-01-19 DIAGNOSIS — Z9189 Other specified personal risk factors, not elsewhere classified: Secondary | ICD-10-CM | POA: Diagnosis not present

## 2022-01-19 DIAGNOSIS — F5081 Binge eating disorder: Secondary | ICD-10-CM | POA: Diagnosis not present

## 2022-01-19 DIAGNOSIS — R7303 Prediabetes: Secondary | ICD-10-CM | POA: Diagnosis not present

## 2022-01-19 DIAGNOSIS — K8020 Calculus of gallbladder without cholecystitis without obstruction: Secondary | ICD-10-CM | POA: Diagnosis not present

## 2022-01-19 DIAGNOSIS — I10 Essential (primary) hypertension: Secondary | ICD-10-CM | POA: Diagnosis not present

## 2022-02-16 ENCOUNTER — Other Ambulatory Visit: Payer: Self-pay | Admitting: General Surgery

## 2022-02-16 DIAGNOSIS — K8020 Calculus of gallbladder without cholecystitis without obstruction: Secondary | ICD-10-CM | POA: Diagnosis not present

## 2022-02-16 DIAGNOSIS — K8010 Calculus of gallbladder with chronic cholecystitis without obstruction: Secondary | ICD-10-CM | POA: Diagnosis not present

## 2022-02-16 HISTORY — PX: CHOLECYSTECTOMY: SHX55

## 2022-04-06 DIAGNOSIS — R948 Abnormal results of function studies of other organs and systems: Secondary | ICD-10-CM | POA: Diagnosis not present

## 2022-04-06 DIAGNOSIS — K8020 Calculus of gallbladder without cholecystitis without obstruction: Secondary | ICD-10-CM | POA: Diagnosis not present

## 2022-04-06 DIAGNOSIS — R7303 Prediabetes: Secondary | ICD-10-CM | POA: Diagnosis not present

## 2022-04-06 DIAGNOSIS — E669 Obesity, unspecified: Secondary | ICD-10-CM | POA: Diagnosis not present

## 2022-04-30 DIAGNOSIS — R7303 Prediabetes: Secondary | ICD-10-CM | POA: Diagnosis not present

## 2022-04-30 DIAGNOSIS — Z6831 Body mass index (BMI) 31.0-31.9, adult: Secondary | ICD-10-CM | POA: Diagnosis not present

## 2022-04-30 DIAGNOSIS — E669 Obesity, unspecified: Secondary | ICD-10-CM | POA: Diagnosis not present

## 2022-07-10 DIAGNOSIS — R0789 Other chest pain: Secondary | ICD-10-CM | POA: Diagnosis not present

## 2022-07-10 DIAGNOSIS — I10 Essential (primary) hypertension: Secondary | ICD-10-CM | POA: Diagnosis not present

## 2022-08-05 ENCOUNTER — Other Ambulatory Visit: Payer: Self-pay | Admitting: Family Medicine

## 2022-08-05 ENCOUNTER — Ambulatory Visit
Admission: RE | Admit: 2022-08-05 | Discharge: 2022-08-05 | Disposition: A | Discharge status: Home / Self Care | Payer: BC Managed Care – PPO | Source: Ambulatory Visit | Attending: Family Medicine | Admitting: Family Medicine

## 2022-08-05 DIAGNOSIS — R0789 Other chest pain: Secondary | ICD-10-CM

## 2022-08-13 DIAGNOSIS — E039 Hypothyroidism, unspecified: Secondary | ICD-10-CM | POA: Diagnosis not present

## 2022-08-13 DIAGNOSIS — R7303 Prediabetes: Secondary | ICD-10-CM | POA: Diagnosis not present

## 2022-08-13 DIAGNOSIS — Z9189 Other specified personal risk factors, not elsewhere classified: Secondary | ICD-10-CM | POA: Diagnosis not present

## 2022-08-13 DIAGNOSIS — I10 Essential (primary) hypertension: Secondary | ICD-10-CM | POA: Diagnosis not present

## 2022-08-13 DIAGNOSIS — K5900 Constipation, unspecified: Secondary | ICD-10-CM | POA: Diagnosis not present

## 2022-08-26 DIAGNOSIS — Z1212 Encounter for screening for malignant neoplasm of rectum: Secondary | ICD-10-CM | POA: Diagnosis not present

## 2022-08-26 DIAGNOSIS — Z1211 Encounter for screening for malignant neoplasm of colon: Secondary | ICD-10-CM | POA: Diagnosis not present

## 2022-09-06 NOTE — Progress Notes (Unsigned)
   [name] [name] 1965/08/07 [id number]   History:  57 y.o. [id number] presents for annual exam without GYN complaints. S/P 2018 TAH BSO for 6 cm dermoid cyst and fibroids, no HRT. Normal pap and mammogram history. HTN managed by PCP, hypoythyroidism managed by endocrinology.   Gynecologic History Patient's last menstrual period was 01/27/2015.   Contraception/Family planning: status post hysterectomy Sexually active: Yes  Health Maintenance Last Pap: 08/20/2020. Results were: Normal Last mammogram: 11/28/2021. Results were: Possible right breast mass, f/u imaging showed benign cyst Last colonoscopy: 01/21/2017. Results were: 3-year recall d/t difficult visualization. Negative Cologuard 08/2022 Last Dexa: Not indicated  Past medical history, past surgical history, family history and social history were all reviewed and documented in the EPIC chart. 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 64 yo son in [place], has 1 and 90 yo, one on the way. 25 yo daughter.   ROS:  A ROS was performed and pertinent positives and negatives are included.  Exam:  Vitals:   09/07/22 0832  BP: 118/78  Pulse: 68  SpO2: 100%  Weight: 167 lb (75.8 kg)  Height: 5\' 4"  (1.626 m)     Body mass index is 28.67 kg/m.  General appearance:  Normal Thyroid:  Symmetrical, normal in size, without palpable masses or nodularity. Respiratory  Auscultation:  Clear without wheezing or rhonchi Cardiovascular  Auscultation:  Regular rate, without rubs, murmurs or gallops  Edema/varicosities:  Not grossly evident Abdominal  Soft,nontender, without masses, guarding or rebound.  Liver/spleen:  No organomegaly noted  Hernia:  None appreciated  Skin  Inspection:  Grossly normal Breasts: Examined lying and sitting.   Right: Without masses, retractions, nipple discharge or axillary adenopathy.   Left: Without masses, retractions, nipple discharge or axillary adenopathy. Genitourinary    Inguinal/mons:  Normal without inguinal adenopathy  External genitalia:  Normal appearing vulva with no masses, tenderness, or lesions  BUS/Urethra/Skene's glands:  Normal  Vagina:  Thin yellow discharge, odor, mild erythema  Cervix:  Absent  Uterus:  Absent  Anus and perineum: Normal  Digital rectal exam: Deferred  Patient informed chaperone available to be present for breast and pelvic exam. Patient has requested no chaperone to be present. Patient has been advised what will be completed during breast and pelvic exam.   Wet prep + clue cells (+ odor)  Assessment/Plan:  57 y.o. [id number] for annual exam.   Well female exam with routine gynecological exam - Education provided on SBEs, importance of preventative screenings, current guidelines, high calcium diet, regular exercise, and multivitamin daily. Labs done elsewhere.   Postmenopausal - no HRT. S/P 2018 TAH BSO for 6 cm dermoid cyst and fibroids  Vaginal discharge - Plan: WET PREP FOR TRICH, YEAST, CLUE. Wet prep positive for clue cells.   Bacterial vaginosis - Plan: metroNIDAZOLE (FLAGYL) 500 MG tablet BID x 7 days.   Screening for cervical cancer - Pap 03/2016 ASCUS negative HPV, otherwise no significant abnormal pap history. Pap 2022 normal. No longer screening for guidelines.   Screening for breast cancer - Normal mammogram history. Continue annual screenings.  Normal breast exam today.  Screening for colon cancer - 2018 colonoscopy. 3-year repeat recommended due to difficult visualization but insurance would not cover. Negative Cologuard 08/2022.   Screening for osteoporosis - Average risk. Will plan DXA at age 60.   Return in 1 year for annual.     [name] DNP, 9:04 AM 09/07/2022

## 2022-09-07 ENCOUNTER — Encounter: Payer: Self-pay | Admitting: Nurse Practitioner

## 2022-09-07 ENCOUNTER — Ambulatory Visit (INDEPENDENT_AMBULATORY_CARE_PROVIDER_SITE_OTHER): Payer: BC Managed Care – PPO | Admitting: Nurse Practitioner

## 2022-09-07 VITALS — BP 118/78 | HR 68 | Ht 64.0 in | Wt 167.0 lb

## 2022-09-07 DIAGNOSIS — N898 Other specified noninflammatory disorders of vagina: Secondary | ICD-10-CM | POA: Diagnosis not present

## 2022-09-07 DIAGNOSIS — Z780 Asymptomatic menopausal state: Secondary | ICD-10-CM

## 2022-09-07 DIAGNOSIS — N760 Acute vaginitis: Secondary | ICD-10-CM

## 2022-09-07 DIAGNOSIS — Z01419 Encounter for gynecological examination (general) (routine) without abnormal findings: Secondary | ICD-10-CM

## 2022-09-07 LAB — WET PREP FOR TRICH, YEAST, CLUE

## 2022-09-07 MED ORDER — METRONIDAZOLE 500 MG PO TABS: 500.0000 mg | ORAL_TABLET | Freq: Two times a day (BID) | ORAL | 0 refills | Status: DC

## 2022-09-14 DIAGNOSIS — R7303 Prediabetes: Secondary | ICD-10-CM | POA: Diagnosis not present

## 2022-09-14 DIAGNOSIS — I10 Essential (primary) hypertension: Secondary | ICD-10-CM | POA: Diagnosis not present

## 2022-09-14 DIAGNOSIS — E039 Hypothyroidism, unspecified: Secondary | ICD-10-CM | POA: Diagnosis not present

## 2022-09-14 DIAGNOSIS — K5900 Constipation, unspecified: Secondary | ICD-10-CM | POA: Diagnosis not present

## 2022-10-30 ENCOUNTER — Other Ambulatory Visit: Payer: Self-pay | Admitting: Family Medicine

## 2022-10-30 DIAGNOSIS — Z1231 Encounter for screening mammogram for malignant neoplasm of breast: Secondary | ICD-10-CM

## 2022-12-01 DIAGNOSIS — D509 Iron deficiency anemia, unspecified: Secondary | ICD-10-CM | POA: Diagnosis not present

## 2022-12-01 DIAGNOSIS — R7303 Prediabetes: Secondary | ICD-10-CM | POA: Diagnosis not present

## 2022-12-01 DIAGNOSIS — E039 Hypothyroidism, unspecified: Secondary | ICD-10-CM | POA: Diagnosis not present

## 2022-12-01 DIAGNOSIS — R748 Abnormal levels of other serum enzymes: Secondary | ICD-10-CM | POA: Diagnosis not present

## 2022-12-01 DIAGNOSIS — Z13220 Encounter for screening for lipoid disorders: Secondary | ICD-10-CM | POA: Diagnosis not present

## 2022-12-08 DIAGNOSIS — E039 Hypothyroidism, unspecified: Secondary | ICD-10-CM | POA: Diagnosis not present

## 2022-12-08 DIAGNOSIS — I10 Essential (primary) hypertension: Secondary | ICD-10-CM | POA: Diagnosis not present

## 2022-12-08 DIAGNOSIS — D649 Anemia, unspecified: Secondary | ICD-10-CM | POA: Diagnosis not present

## 2022-12-08 DIAGNOSIS — L309 Dermatitis, unspecified: Secondary | ICD-10-CM | POA: Diagnosis not present

## 2022-12-08 DIAGNOSIS — Z23 Encounter for immunization: Secondary | ICD-10-CM | POA: Diagnosis not present

## 2022-12-08 DIAGNOSIS — Z0000 Encounter for general adult medical examination without abnormal findings: Secondary | ICD-10-CM | POA: Diagnosis not present

## 2022-12-10 DIAGNOSIS — R7303 Prediabetes: Secondary | ICD-10-CM | POA: Diagnosis not present

## 2022-12-10 DIAGNOSIS — I10 Essential (primary) hypertension: Secondary | ICD-10-CM | POA: Diagnosis not present

## 2022-12-10 DIAGNOSIS — Z9189 Other specified personal risk factors, not elsewhere classified: Secondary | ICD-10-CM | POA: Diagnosis not present

## 2022-12-10 DIAGNOSIS — E039 Hypothyroidism, unspecified: Secondary | ICD-10-CM | POA: Diagnosis not present

## 2022-12-10 DIAGNOSIS — K5900 Constipation, unspecified: Secondary | ICD-10-CM | POA: Diagnosis not present

## 2022-12-11 ENCOUNTER — Ambulatory Visit
Admission: RE | Admit: 2022-12-11 | Discharge: 2022-12-11 | Disposition: A | Discharge status: Home / Self Care | Payer: BC Managed Care – PPO | Source: Ambulatory Visit | Attending: Family Medicine | Admitting: Family Medicine

## 2022-12-11 DIAGNOSIS — Z1231 Encounter for screening mammogram for malignant neoplasm of breast: Secondary | ICD-10-CM

## 2023-01-25 ENCOUNTER — Inpatient Hospital Stay: Payer: BC Managed Care – PPO | Attending: Nurse Practitioner | Admitting: Nurse Practitioner

## 2023-01-25 ENCOUNTER — Inpatient Hospital Stay: Payer: BC Managed Care – PPO

## 2023-01-25 NOTE — Progress Notes (Deleted)
[facility]   Telephone:([phone] [phone]) [phone]   Clinic New consult Note   Patient Care Team: Daisy Floro, MD as PCP - General (Family Medicine) [name], NP as Nurse Practitioner (Gynecology) 01/25/2023  CHIEF COMPLAINTS/PURPOSE OF CONSULTATION:  Anemia and leukopenia, referred by PCP Dr. Duane Lope  HISTORY OF PRESENTING ILLNESS:  [name] [name] 57 y.o. female with PMH including hypothyroidism, HTN, HL, eczema, and tobacco use is here because of low hemoglobin and WBC.  Recent labs 11/27/2022 PCP showed WBC 2.9, RBC 3.67, Hgb 11.4, HCT 33.5, absolute neutrophil 1.3.  The remaining CBC and differential were normal.  CMP with normal creatinine.  Iron studies showed TIBC 278, serum iron 71, 26% iron saturation and a transferrin of 193.  B12 in normal range at 726.  Prior labs 09/24/2021 showed WBC 3.9 and Hgb 12, 10/04/2020 WBC 4.3 and Hgb 11.6, 09/12/2019 WBC 3.3 and Hgb 11.6, 01/19/2018 WBC 3.6 and Hgb 12.1.   ***She was found to have abnormal CBC from *** ***She denies recent chest pain on exertion, shortness of breath on minimal exertion, pre-syncopal episodes, or palpitations. ***She had not noticed any recent bleeding such as epistaxis, hematuria or hematochezia ***The patient denies over the counter NSAID ingestion. She is not *** on antiplatelets agents. Her last colonoscopy was *** ***She had no prior history or diagnosis of cancer. Her age appropriate screening programs are up-to-date. ***She denies any pica and eats a variety of diet. ***She never donated blood or received blood transfusion ***The patient was prescribed oral iron supplements and she takes ***  MEDICAL HISTORY:  Past Medical History:  Diagnosis Date   Anemia    Ectopic pregnancy 1990   HISTORY OF   HPV in female    Hypertension    Hypothyroid    PONV (postoperative nausea and vomiting)     SURGICAL HISTORY: Past Surgical History:  Procedure Laterality  Date   ABDOMINAL HYSTERECTOMY     CESAREAN SECTION  1995, 2006   BTL AT 2006 C/S   CHOLECYSTECTOMY  02/16/2022   COLONOSCOPY     ECTOPIC PREGNANCY SURGERY  1990   HYSTEROSCOPY  11/2008   HYSTEROSCOPY, D&C   TUBAL LIGATION  2006   AT 2006 C/S   tummy tuck  01/2015   WISDOM TOOTH EXTRACTION      SOCIAL HISTORY: Social History   Socioeconomic History   Marital status: Married    Spouse name: Not on file   Number of children: Not on file   Years of education: Not on file   Highest education level: Not on file  Occupational History   Not on file  Tobacco Use   Smoking status: Former    Current packs/day: 0.00    Average packs/day: 0.3 packs/day for 15.0 years (3.8 ttl pk-yrs)    Types: Cigarettes    Start date: 04/09/1989    Quit date: 04/09/2004    Years since quitting: 18.8   Smokeless tobacco: Never  Vaping Use   Vaping status: Never Used  Substance and Sexual Activity   Alcohol use: Yes    Alcohol/week: 6.0 standard drinks of alcohol    Types: 6 Cans of beer per week    Comment: Beer/wine   Drug use: No   Sexual activity: Yes    Birth control/protection: Surgical    Comment: Tubal lig/Hyst  Other Topics Concern   Not on file  Social History Narrative   Not on file   Social Determinants of Health  Financial Resource Strain: Not on file  Food Insecurity: Not on file  Transportation Needs: Not on file  Physical Activity: Not on file  Stress: Not on file  Social Connections: Not on file  Intimate Partner Violence: Not on file    FAMILY HISTORY: Family History  Problem Relation Age of Onset   Hypertension Mother    Diabetes Mother    Hypertension Father    Cancer Father        Lung cancer   Hypertension Sister    Breast cancer Sister    Heart attack Sister    Heart disease Maternal Grandmother    Breast cancer Paternal Grandmother 42    ALLERGIES:  is allergic to morphine and codeine.  MEDICATIONS:  Current Outpatient Medications  Medication Sig  Dispense Refill   halobetasol (ULTRAVATE) 0.05 % cream Apply 1 application 2 (two) times daily as needed topically (skin irritation).     hydrochlorothiazide (HYDRODIURIL) 25 MG tablet Take 12.5 mg by mouth every morning.     levothyroxine (SYNTHROID, LEVOTHROID) 88 MCG tablet Take 88 mcg by mouth daily.  0   losartan (COZAAR) 100 MG tablet Take 100 mg by mouth daily.     metroNIDAZOLE (FLAGYL) 500 MG tablet Take 1 tablet (500 mg total) by mouth 2 (two) times daily. 14 tablet 0   Multiple Vitamin (MULTIVITAMIN) capsule Take 1 capsule by mouth daily.       Omega-3 Fatty Acids (FISH OIL PO) Take 1 tablet by mouth daily.      Probiotic Product (PROBIOTIC PO) Take 1 tablet by mouth daily.      VITAMIN D PO Take by mouth.     No current facility-administered medications for this visit.    REVIEW OF SYSTEMS:   Constitutional: Denies fevers, chills or abnormal night sweats Eyes: Denies blurriness of vision, double vision or watery eyes Ears, nose, mouth, throat, and face: Denies mucositis or sore throat Respiratory: Denies cough, dyspnea or wheezes Cardiovascular: Denies palpitation, chest discomfort or lower extremity swelling Gastrointestinal:  Denies nausea, heartburn or change in bowel habits Skin: Denies abnormal skin rashes Lymphatics: Denies new lymphadenopathy or easy bruising Neurological:Denies numbness, tingling or new weaknesses Behavioral/Psych: Mood is stable, no new changes  All other systems were reviewed with the patient and are negative.  PHYSICAL EXAMINATION: ECOG PERFORMANCE STATUS: {CHL ONC ECOG PS:[id number]}  There were no vitals filed for this visit. There were no vitals filed for this visit.  GENERAL:alert, no distress and comfortable SKIN: skin color, texture, turgor are normal, no rashes or significant lesions EYES: normal, conjunctiva are pink and non-injected, sclera clear OROPHARYNX:no exudate, no erythema and lips, buccal mucosa, and tongue normal  NECK:  supple, thyroid normal size, non-tender, without nodularity LYMPH:  no palpable lymphadenopathy in the cervical, axillary or inguinal LUNGS: clear to auscultation and percussion with normal breathing effort HEART: regular rate & rhythm and no murmurs and no lower extremity edema ABDOMEN:abdomen soft, non-tender and normal bowel sounds Musculoskeletal:no cyanosis of digits and no clubbing  PSYCH: alert & oriented x 3 with fluent speech NEURO: no focal motor/sensory deficits  LABORATORY DATA:  I have reviewed the data as listed    Latest Ref Rng & Units 07/19/2019    9:09 AM 01/26/2017    8:20 AM 04/18/2014    9:00 AM  CBC  WBC 3.8 - 10.8 Thousand/uL 4.2  4.5  3.3   Hemoglobin 11.7 - 15.5 g/dL 30.8  65.7  84.6   Hematocrit 35.0 - 45.0 %  34.9  37.4  33.5   Platelets 140 - 400 Thousand/uL 192  196  189        Latest Ref Rng & Units 07/19/2019    9:09 AM 01/26/2017    8:20 AM  CMP  Glucose 65 - 99 mg/dL 161  096   BUN 7 - 25 mg/dL 18  20   Creatinine 0.45 - 1.05 mg/dL 4.09  8.11   Sodium 914 - 146 mmol/L 141  143   Potassium 3.5 - 5.3 mmol/L 3.7  3.6   Chloride 98 - 110 mmol/L 107  107   CO2 20 - 32 mmol/L 26  28   Calcium 8.6 - 10.4 mg/dL 9.3  9.4   Total Protein 6.1 - 8.1 g/dL 7.6  8.7   Total Bilirubin 0.2 - 1.2 mg/dL 0.4  0.2   Alkaline Phos 38 - 126 U/L  65   AST 10 - 35 U/L 18  18   ALT 6 - 29 U/L 25  24      RADIOGRAPHIC STUDIES: I have personally reviewed the radiological images as listed and agreed with the findings in the report. No results found.  ASSESSMENT & PLAN:  No problem-specific Assessment & Plan notes found for this encounter.    All questions were answered. The patient knows to call the clinic with any problems, questions or concerns. I spent {CHL ONC TIME VISIT - NWGNF:[id number]} counseling the patient face to face. The total time spent in the appointment was {CHL ONC TIME VISIT - IONGE:[id number]} and more than 50% was on counseling.     Pollyann Samples, NP 01/25/23 7:55 AM

## 2023-02-08 ENCOUNTER — Inpatient Hospital Stay: Payer: BC Managed Care – PPO | Attending: Nurse Practitioner | Admitting: Nurse Practitioner

## 2023-02-08 ENCOUNTER — Encounter: Payer: Self-pay | Admitting: Nurse Practitioner

## 2023-02-08 ENCOUNTER — Inpatient Hospital Stay: Payer: BC Managed Care – PPO

## 2023-02-08 VITALS — BP 126/83 | HR 73 | Temp 98.1°F | Resp 17 | Ht 64.0 in | Wt 164.6 lb

## 2023-02-08 DIAGNOSIS — I110 Hypertensive heart disease with heart failure: Secondary | ICD-10-CM

## 2023-02-08 DIAGNOSIS — D709 Neutropenia, unspecified: Secondary | ICD-10-CM

## 2023-02-08 DIAGNOSIS — I10 Essential (primary) hypertension: Secondary | ICD-10-CM | POA: Insufficient documentation

## 2023-02-08 DIAGNOSIS — E785 Hyperlipidemia, unspecified: Secondary | ICD-10-CM | POA: Diagnosis not present

## 2023-02-08 DIAGNOSIS — D649 Anemia, unspecified: Secondary | ICD-10-CM | POA: Diagnosis not present

## 2023-02-08 DIAGNOSIS — D72819 Decreased white blood cell count, unspecified: Secondary | ICD-10-CM | POA: Insufficient documentation

## 2023-02-08 LAB — FERRITIN: Ferritin: 174 ng/mL (ref 11–307)

## 2023-02-08 LAB — RETIC PANEL
Immature Retic Fract: 4.4 % (ref 2.3–15.9)
RBC.: 3.96 MIL/uL (ref 3.87–5.11)
Retic Count, Absolute: 35.6 10*3/uL (ref 19.0–186.0)
Retic Ct Pct: 0.9 % (ref 0.4–3.1)
Reticulocyte Hemoglobin: 34.1 pg (ref >27.9)

## 2023-02-08 LAB — CBC WITH DIFFERENTIAL (CANCER CENTER ONLY)
Abs Immature Granulocytes: 0 10*3/uL (ref 0.00–0.07)
Basophils Absolute: 0 10*3/uL (ref 0.0–0.1)
Basophils Relative: 1 %
Eosinophils Absolute: 0.1 10*3/uL (ref 0.0–0.5)
Eosinophils Relative: 2 %
HCT: 35.5 % — ABNORMAL LOW (ref 36.0–46.0)
Hemoglobin: 12.4 g/dL (ref 12.0–15.0)
Immature Granulocytes: 0 %
Lymphocytes Relative: 40 %
Lymphs Abs: 1.2 10*3/uL (ref 0.7–4.0)
MCH: 31.5 pg (ref 26.0–34.0)
MCHC: 34.9 g/dL (ref 30.0–36.0)
MCV: 90.1 fL (ref 80.0–100.0)
Monocytes Absolute: 0.3 10*3/uL (ref 0.1–1.0)
Monocytes Relative: 9 %
Neutro Abs: 1.5 10*3/uL — ABNORMAL LOW (ref 1.7–7.7)
Neutrophils Relative %: 48 %
Platelet Count: 213 10*3/uL (ref 150–400)
RBC: 3.94 MIL/uL (ref 3.87–5.11)
RDW: 12.2 % (ref 11.5–15.5)
WBC Count: 3 10*3/uL — ABNORMAL LOW (ref 4.0–10.5)
nRBC: 0 % (ref 0.0–0.2)

## 2023-02-08 LAB — IRON AND IRON BINDING CAPACITY (CC-WL,HP ONLY)
Iron: 73 ug/dL (ref 28–170)
Saturation Ratios: 24 % (ref 10.4–31.8)
TIBC: 301 ug/dL (ref 250–450)
UIBC: 228 ug/dL (ref 148–442)

## 2023-02-08 LAB — VITAMIN B12: Vitamin B-12: 632 pg/mL (ref 180–914)

## 2023-02-08 LAB — FOLATE: Folate: 27.3 ng/mL (ref >5.9)

## 2023-02-08 NOTE — Progress Notes (Cosign Needed)
[facility]   [phone] [phone]   Clinic New consult Note   Patient Care Team: Daisy Floro, MD as PCP - General (Family Medicine) [name], NP as Nurse Practitioner (Gynecology) 02/08/2023  CHIEF COMPLAINTS/PURPOSE OF CONSULTATION:  Anemia and leukopenia, referred by PCP Dr. Duane Lope  HISTORY OF PRESENTING ILLNESS:  [name] [name] 57 y.o. female with PMH including hypothyroidism, HTN, HL, eczema/dry skin, and fatty liver is here because of low hemoglobin and WBC.  Recent labs 11/27/2022 PCP showed WBC 2.9, RBC 3.67, Hgb 11.4, HCT 33.5, absolute neutrophil 1.3.  The remaining CBC and differential were normal.  CMP with normal creatinine.  Iron studies showed TIBC 278, serum iron 71, 26% iron saturation and a transferrin of 193.  B12 in normal range at 726.  Prior labs 09/24/2021 showed WBC 3.9 and Hgb 12, 10/04/2020 WBC 4.3 and Hgb 11.6, 09/12/2019 WBC 3.3 and Hgb 11.6, 01/19/2018 WBC 3.6 and Hgb 12.1. She previously donated blood periodically but stopped when she was turned away several times for anemia; intolerant to oral iron, eats iron-rich diet. Denies bleeding. S/p hysterectomy in 2016. Denies pica, h/o bariatric surgery, chronic/recurrent infections or autoimmune disorder. She is Tree surgeon.   Socially, she is married, works as a Adult nurse. Independent with ADLs. Drinks alcohol on the weekends, smoked 3 cigarettes per day from age 33 - year 2006. Denies current drug use. She is up to date on age appropriate cancer screenings. MGM had breast cancer, sister had breast cancer (in remission), and father had prostate cancer. Denies family history of anemia or blood disorders.   Today, she presents by herself. She is tired but functional and able to do all activities. Lost 40 lbs intentionally on [place]. Denies fever, night sweats, adenopathy, bleeding, cough, chest pain, dyspnea, leg edema, rash, or pain.    MEDICAL  HISTORY:  Past Medical History:  Diagnosis Date   Anemia    Ectopic pregnancy 1990   HISTORY OF   HPV in female    Hypertension    Hypothyroid    PONV (postoperative nausea and vomiting)     SURGICAL HISTORY: Past Surgical History:  Procedure Laterality Date   ABDOMINAL HYSTERECTOMY     CESAREAN SECTION  1995, 2006   BTL AT 2006 C/S   CHOLECYSTECTOMY  02/16/2022   COLONOSCOPY     ECTOPIC PREGNANCY SURGERY  1990   HYSTEROSCOPY  11/2008   HYSTEROSCOPY, D&C   TUBAL LIGATION  2006   AT 2006 C/S   tummy tuck  01/2015   WISDOM TOOTH EXTRACTION      SOCIAL HISTORY: Social History   Socioeconomic History   Marital status: Married    Spouse name: Not on file   Number of children: Not on file   Years of education: Not on file   Highest education level: Not on file  Occupational History   Not on file  Tobacco Use   Smoking status: Former    Current packs/day: 0.00    Average packs/day: 0.3 packs/day for 15.0 years (3.8 ttl pk-yrs)    Types: Cigarettes    Start date: 04/09/1989    Quit date: 04/09/2004    Years since quitting: 18.8   Smokeless tobacco: Never  Vaping Use   Vaping status: Never Used  Substance and Sexual Activity   Alcohol use: Yes    Alcohol/week: 6.0 standard drinks of alcohol    Types: 6 Cans of beer per week    Comment: Beer/wine  Drug use: No   Sexual activity: Yes    Birth control/protection: Surgical    Comment: Tubal lig/Hyst  Other Topics Concern   Not on file  Social History Narrative   Not on file   Social Determinants of Health   Financial Resource Strain: Not on file  Food Insecurity: Not on file  Transportation Needs: Not on file  Physical Activity: Not on file  Stress: Not on file  Social Connections: Not on file  Intimate Partner Violence: Not on file    FAMILY HISTORY: Family History  Problem Relation Age of Onset   Hypertension Mother    Diabetes Mother    Hypertension Father    Cancer Father        Lung cancer    Hypertension Sister    Breast cancer Sister    Heart attack Sister    Heart disease Maternal Grandmother    Breast cancer Paternal Grandmother 63    ALLERGIES:  is allergic to morphine and codeine.  MEDICATIONS:  Current Outpatient Medications  Medication Sig Dispense Refill   hydrochlorothiazide (HYDRODIURIL) 25 MG tablet Take 12.5 mg by mouth every morning.     levothyroxine (SYNTHROID, LEVOTHROID) 88 MCG tablet Take 88 mcg by mouth daily.  0   losartan (COZAAR) 100 MG tablet Take 100 mg by mouth daily.     Multiple Vitamin (MULTIVITAMIN) capsule Take 1 capsule by mouth daily.       Omega-3 Fatty Acids (FISH OIL PO) Take 1 tablet by mouth daily.      Probiotic Product (PROBIOTIC PO) Take 1 tablet by mouth daily.      VITAMIN D PO Take by mouth.     WEGOVY 2.4 MG/0.75ML SOAJ Inject into the skin.     halobetasol (ULTRAVATE) 0.05 % cream Apply 1 application 2 (two) times daily as needed topically (skin irritation). (Patient not taking: Reported on 02/08/2023)     No current facility-administered medications for this visit.    REVIEW OF SYSTEMS:   Constitutional: Denies fevers, chills or abnormal night sweats (+) fatigue (+) intentional weight loss  Eyes: Denies blurriness of vision, double vision or watery eyes Ears, nose, mouth, throat, and face: Denies mucositis or sore throat Respiratory: Denies cough, dyspnea or wheezes Cardiovascular: Denies palpitation, chest discomfort or lower extremity swelling Gastrointestinal:  Denies nausea, vomiting, hematochezia, heartburn or change in bowel habits (+) Constipation  Skin: Denies abnormal skin rashes (+) eczema/dry skin Lymphatics: Denies new lymphadenopathy or easy bruising Neurological:Denies numbness, tingling or new weaknesses Behavioral/Psych: Mood is stable, no new changes  All other systems were reviewed with the patient and are negative.  PHYSICAL EXAMINATION: ECOG PERFORMANCE STATUS: 0 - Asymptomatic  Vitals:   02/08/23  1238 02/08/23 1243  BP: (!) 142/100 126/83  Pulse: 73   Resp: 17   Temp: 98.1 F (36.7 C)   SpO2: 100%    Filed Weights   02/08/23 1238  Weight: 164 lb 9.6 oz (74.7 kg)    GENERAL:alert, no distress and comfortable SKIN: skin color, texture, turgor are normal, no rashes or significant lesions EYES: normal, conjunctiva are pink and non-injected, sclera clear OROPHARYNX:no exudate, no erythema and lips, buccal mucosa, and tongue normal  NECK: supple, thyroid normal size, non-tender, without nodularity LYMPH:  no palpable lymphadenopathy in the cervical, axillary or inguinal LUNGS: clear to auscultation and percussion with normal breathing effort HEART: regular rate & rhythm and no murmurs and no lower extremity edema ABDOMEN:abdomen soft, non-tender and normal bowel sounds Musculoskeletal:no cyanosis  of digits and no clubbing  PSYCH: alert & oriented x 3 with fluent speech NEURO: no focal motor/sensory deficits  LABORATORY DATA:  I have reviewed the data as listed    Latest Ref Rng & Units 02/08/2023    1:57 PM 07/19/2019    9:09 AM 01/26/2017    8:20 AM  CBC  WBC 4.0 - 10.5 K/uL 3.0  4.2  4.5   Hemoglobin 12.0 - 15.0 g/dL 16.1  09.6  04.5   Hematocrit 36.0 - 46.0 % 35.5  34.9  37.4   Platelets 150 - 400 K/uL 213  192  196        Latest Ref Rng & Units 07/19/2019    9:09 AM 01/26/2017    8:20 AM  CMP  Glucose 65 - 99 mg/dL 409  811   BUN 7 - 25 mg/dL 18  20   Creatinine 9.14 - 1.05 mg/dL 7.82  9.56   Sodium 213 - 146 mmol/L 141  143   Potassium 3.5 - 5.3 mmol/L 3.7  3.6   Chloride 98 - 110 mmol/L 107  107   CO2 20 - 32 mmol/L 26  28   Calcium 8.6 - 10.4 mg/dL 9.3  9.4   Total Protein 6.1 - 8.1 g/dL 7.6  8.7   Total Bilirubin 0.2 - 1.2 mg/dL 0.4  0.2   Alkaline Phos 38 - 126 U/L  65   AST 10 - 35 U/L 18  18   ALT 6 - 29 U/L 25  24      RADIOGRAPHIC STUDIES: I have personally reviewed the radiological images as listed and agreed with the findings in the  report. No results found.  ASSESSMENT & PLAN: 57 yo female with   Anemia, leukopenia/neutropenia -We reviewed her medical record in detail with the patient -She has a mild and intermittent normocytic anemia hgb 11.4 - normal and leukopenia (lowest WBC 2.9/ANC 1.3) with predominant neutropenia. PLT and remainder of differential is normal -She is post-menopausal without obvious bleeding, recent negative cologuard and up to date on other cancer screenings, no prior bariatric surgery or recurrent infection -We reviewed other etiologies including normal lab variation, benign ethnic neutropenia, auto-immune, or other -Will repeat lab today to r/o nutritional deficiencies  -A bone marrow condition such as MDS is less likely, given the mild/intermittent nature. A bone marrow biopsy is not being recommended at this time -Reviewed neutropenic precautions -We recommend to switch multivitamin with prenatal vitamin, take once daily  -Will f/up with labs and monitor -Lab and f/up in 6 months, or sooner if needed   PLAN: -Outside labs reviewed -Lab today, will call with results -Switch to prenatal vitamin po once daily -Monitor CBC q6 months for now -F/up in 6 months, or sooner if needed -Pt seen with Dr. Mosetta Putt  Orders Placed This Encounter  Procedures   CBC with Differential (Cancer Center Only)    Standing Status:   Future    Number of Occurrences:   1    Standing Expiration Date:   02/08/2024   Ferritin    Standing Status:   Future    Number of Occurrences:   1    Standing Expiration Date:   02/08/2024   Retic Panel    Standing Status:   Future    Number of Occurrences:   1    Standing Expiration Date:   02/08/2024   Methylmalonic acid, serum    Standing Status:   Future    Number of  Occurrences:   1    Standing Expiration Date:   02/08/2024   Vitamin B12    Standing Status:   Future    Number of Occurrences:   1    Standing Expiration Date:   02/08/2024   Folate, Serum    Standing  Status:   Future    Number of Occurrences:   1    Standing Expiration Date:   02/08/2024   Iron and Iron Binding Capacity ([facility] only)    Standing Status:   Future    Number of Occurrences:   1    Standing Expiration Date:   02/08/2024     All questions were answered. The patient knows to call the clinic with any problems, questions or concerns.      Pollyann Samples, NP 02/08/23   Addendum I have seen the patient, examined her. I agree with the assessment and and plan and have edited the notes.   57 year old postmenopausal woman with past medical history of hypertension, dyslipidemia, eczema, who was referred for intermittent mild anemia and leukopenia.  Iron study showed slightly low transferrin saturation.  She had menopause more than 5 years ago and subsequently had a hysterectomy.  No other clinical signs of bleeding, last colonoscopy was 5 to 6 years ago and a recent Cologuard was negative.  Will repeat her lab to rule out nutritional anemia today, and I recommend over-the-counter multivitamins such as prenatal vitamins.  She also had intermittent mild leukopenia/neutropenia, no frequent infection history. this is likely benign, possible related to her being African-American, or autoimmune related.  No treatment required.  I recommend lab and clinical monitoring.  All questions were answered, I spent a total of 30 minutes for her visit today, more than 50% time on face-to-face counseling.  Malachy Mood MD 02/08/2023

## 2023-02-09 ENCOUNTER — Encounter: Payer: Self-pay | Admitting: Nurse Practitioner

## 2023-02-10 LAB — METHYLMALONIC ACID, SERUM: Methylmalonic Acid, Quantitative: 157 nmol/L (ref 0–378)

## 2023-03-24 DIAGNOSIS — L255 Unspecified contact dermatitis due to plants, except food: Secondary | ICD-10-CM | POA: Diagnosis not present

## 2023-05-10 DIAGNOSIS — Z79899 Other long term (current) drug therapy: Secondary | ICD-10-CM | POA: Diagnosis not present

## 2023-05-10 DIAGNOSIS — E039 Hypothyroidism, unspecified: Secondary | ICD-10-CM | POA: Diagnosis not present

## 2023-05-10 DIAGNOSIS — Z6829 Body mass index (BMI) 29.0-29.9, adult: Secondary | ICD-10-CM | POA: Diagnosis not present

## 2023-05-10 DIAGNOSIS — R7303 Prediabetes: Secondary | ICD-10-CM | POA: Diagnosis not present

## 2023-05-10 DIAGNOSIS — K5900 Constipation, unspecified: Secondary | ICD-10-CM | POA: Diagnosis not present

## 2023-05-10 DIAGNOSIS — I10 Essential (primary) hypertension: Secondary | ICD-10-CM | POA: Diagnosis not present

## 2023-05-10 DIAGNOSIS — E669 Obesity, unspecified: Secondary | ICD-10-CM | POA: Diagnosis not present

## 2023-06-22 DIAGNOSIS — L2089 Other atopic dermatitis: Secondary | ICD-10-CM | POA: Diagnosis not present

## 2023-07-16 ENCOUNTER — Other Ambulatory Visit: Payer: Self-pay

## 2023-07-16 DIAGNOSIS — D709 Neutropenia, unspecified: Secondary | ICD-10-CM

## 2023-07-16 DIAGNOSIS — D649 Anemia, unspecified: Secondary | ICD-10-CM

## 2023-07-18 NOTE — Progress Notes (Deleted)
 [facility]     [phone] [phone]    Patient Care Team: Jimmey Mould, MD as PCP - General (Family Medicine) Andee Bamberger, NP as Nurse Practitioner (Gynecology)   CHIEF COMPLAINT: Follow up anemia and leukopenia  CURRENT THERAPY: Prenatal vitamin po once daily  INTERVAL HISTORY Ms. Henly returns for follow up, last seen by me 02/2023 for initial visit.   ROS   Past Medical History:  Diagnosis Date   Anemia    Ectopic pregnancy 1990   HISTORY OF   HPV in female    Hypertension    Hypothyroid    PONV (postoperative nausea and vomiting)      Past Surgical History:  Procedure Laterality Date   ABDOMINAL HYSTERECTOMY     CESAREAN SECTION  1995, 2006   BTL AT 2006 C/S   CHOLECYSTECTOMY  02/16/2022   COLONOSCOPY     ECTOPIC PREGNANCY SURGERY  1990   HYSTEROSCOPY  11/2008   HYSTEROSCOPY, D&C   TUBAL LIGATION  2006   AT 2006 C/S   tummy tuck  01/2015   WISDOM TOOTH EXTRACTION       Outpatient Encounter Medications as of 07/19/2023  Medication Sig   halobetasol (ULTRAVATE) 0.05 % cream Apply 1 application 2 (two) times daily as needed topically (skin irritation). (Patient not taking: Reported on 02/08/2023)   hydrochlorothiazide (HYDRODIURIL) 25 MG tablet Take 12.5 mg by mouth every morning.   levothyroxine (SYNTHROID, LEVOTHROID) 88 MCG tablet Take 88 mcg by mouth daily.   losartan (COZAAR) 100 MG tablet Take 100 mg by mouth daily.   Multiple Vitamin (MULTIVITAMIN) capsule Take 1 capsule by mouth daily.     Omega-3 Fatty Acids (FISH OIL PO) Take 1 tablet by mouth daily.    Probiotic Product (PROBIOTIC PO) Take 1 tablet by mouth daily.    VITAMIN D PO Take by mouth.   WEGOVY 2.4 MG/0.75ML SOAJ Inject into the skin.   No facility-administered encounter medications on file as of 07/19/2023.     There were no vitals filed for this visit. There is no height or weight on file to calculate BMI.   ECOG  PERFORMANCE STATUS: {CHL ONC ECOG [phone]}  PHYSICAL EXAM GENERAL:alert, no distress and comfortable SKIN: no rash  EYES: sclera clear NECK: without mass LYMPH:  no palpable cervical or supraclavicular lymphadenopathy  LUNGS: clear with normal breathing effort HEART: regular rate & rhythm, no lower extremity edema ABDOMEN: abdomen soft, non-tender and normal bowel sounds NEURO: alert & oriented x 3 with fluent speech, no focal motor/sensory deficits Breast exam:  PAC without erythema    CBC    Latest Ref Rng & Units 02/08/2023    1:57 PM 07/19/2019    9:09 AM 01/26/2017    8:20 AM  CBC  WBC 4.0 - 10.5 K/uL 3.0  4.2  4.5   Hemoglobin 12.0 - 15.0 g/dL 45.4  09.8  11.9   Hematocrit 36.0 - 46.0 % 35.5  34.9  37.4   Platelets 150 - 400 K/uL 213  192  196       CMP     Latest Ref Rng & Units 07/19/2019    9:09 AM 01/26/2017    8:20 AM  CMP  Glucose 65 - 99 mg/dL 147  829   BUN 7 - 25 mg/dL 18  20   Creatinine 5.62 - 1.05 mg/dL 1.30  8.65   Sodium 784 - 146 mmol/L 141  143   Potassium 3.5 - 5.3 mmol/L 3.7  3.6   Chloride 98 - 110 mmol/L 107  107   CO2 20 - 32 mmol/L 26  28   Calcium 8.6 - 10.4 mg/dL 9.3  9.4   Total Protein 6.1 - 8.1 g/dL 7.6  8.7   Total Bilirubin 0.2 - 1.2 mg/dL 0.4  0.2   Alkaline Phos 38 - 126 U/L  65   AST 10 - 35 U/L 18  18   ALT 6 - 29 U/L 25  24       ASSESSMENT & PLAN:58 yo female with    Anemia, leukopenia/neutropenia -She has a mild and intermittent normocytic anemia hgb 11.4 - normal and leukopenia (lowest WBC 2.9/ANC 1.3) with predominant neutropenia. PLT and remainder of differential is normal -She is post-menopausal without obvious bleeding, recent negative cologuard and up to date on other cancer screenings, no prior bariatric surgery or recurrent infection -We reviewed other etiologies including normal lab variation, benign ethnic neutropenia, auto-immune, or other -A bone marrow condition such as MDS is less likely, given the  mild/intermittent nature. A bone marrow biopsy is not being recommended at this time -Reviewed neutropenic precautions -We recommend to switch multivitamin with prenatal vitamin, take once daily      PLAN:  No orders of the defined types were placed in this encounter.     All questions were answered. The patient knows to call the clinic with any problems, questions or concerns. No barriers to learning were detected. I spent *** counseling the patient face to face. The total time spent in the appointment was *** and more than 50% was on counseling, review of test results, and coordination of care.   Venus Gilles K Corban Kistler, NP 07/18/2023 5:57 PM

## 2023-07-19 ENCOUNTER — Inpatient Hospital Stay: Payer: BC Managed Care – PPO | Attending: Family Medicine

## 2023-07-19 ENCOUNTER — Inpatient Hospital Stay: Payer: BC Managed Care – PPO | Admitting: Nurse Practitioner

## 2023-08-17 DIAGNOSIS — I10 Essential (primary) hypertension: Secondary | ICD-10-CM | POA: Diagnosis not present

## 2023-08-17 DIAGNOSIS — K5900 Constipation, unspecified: Secondary | ICD-10-CM | POA: Diagnosis not present

## 2023-08-17 DIAGNOSIS — E039 Hypothyroidism, unspecified: Secondary | ICD-10-CM | POA: Diagnosis not present

## 2023-08-17 DIAGNOSIS — R7303 Prediabetes: Secondary | ICD-10-CM | POA: Diagnosis not present

## 2023-11-26 ENCOUNTER — Other Ambulatory Visit: Payer: Self-pay | Admitting: Family Medicine

## 2023-11-26 DIAGNOSIS — Z1231 Encounter for screening mammogram for malignant neoplasm of breast: Secondary | ICD-10-CM

## 2023-12-15 ENCOUNTER — Ambulatory Visit
Admission: RE | Admit: 2023-12-15 | Discharge: 2023-12-15 | Disposition: A | Discharge status: Home / Self Care | Source: Ambulatory Visit | Attending: Family Medicine | Admitting: Family Medicine

## 2023-12-15 DIAGNOSIS — Z1231 Encounter for screening mammogram for malignant neoplasm of breast: Secondary | ICD-10-CM

## 2023-12-16 DIAGNOSIS — E7800 Pure hypercholesterolemia, unspecified: Secondary | ICD-10-CM | POA: Diagnosis not present

## 2023-12-16 DIAGNOSIS — D509 Iron deficiency anemia, unspecified: Secondary | ICD-10-CM | POA: Diagnosis not present

## 2023-12-16 DIAGNOSIS — I10 Essential (primary) hypertension: Secondary | ICD-10-CM | POA: Diagnosis not present

## 2023-12-16 DIAGNOSIS — E039 Hypothyroidism, unspecified: Secondary | ICD-10-CM | POA: Diagnosis not present

## 2023-12-16 DIAGNOSIS — Z23 Encounter for immunization: Secondary | ICD-10-CM | POA: Diagnosis not present

## 2023-12-16 DIAGNOSIS — Z0000 Encounter for general adult medical examination without abnormal findings: Secondary | ICD-10-CM | POA: Diagnosis not present

## 2024-04-06 ENCOUNTER — Encounter: Payer: Self-pay | Admitting: Nurse Practitioner

## 2024-04-06 ENCOUNTER — Ambulatory Visit: Admitting: Nurse Practitioner

## 2024-04-06 VITALS — BP 118/74 | HR 68 | Ht 63.25 in | Wt 167.0 lb

## 2024-04-06 DIAGNOSIS — Z01419 Encounter for gynecological examination (general) (routine) without abnormal findings: Secondary | ICD-10-CM

## 2024-04-06 DIAGNOSIS — N898 Other specified noninflammatory disorders of vagina: Secondary | ICD-10-CM

## 2024-04-06 DIAGNOSIS — N760 Acute vaginitis: Secondary | ICD-10-CM | POA: Diagnosis not present

## 2024-04-06 DIAGNOSIS — Z780 Asymptomatic menopausal state: Secondary | ICD-10-CM | POA: Diagnosis not present

## 2024-04-06 DIAGNOSIS — Z1331 Encounter for screening for depression: Secondary | ICD-10-CM | POA: Diagnosis not present

## 2024-04-06 DIAGNOSIS — B9689 Other specified bacterial agents as the cause of diseases classified elsewhere: Secondary | ICD-10-CM | POA: Diagnosis not present

## 2024-04-06 LAB — WET PREP FOR TRICH, YEAST, CLUE

## 2024-04-06 MED ORDER — METRONIDAZOLE 0.75 % VA GEL: 1.0000 | Freq: Every day | VAGINAL | 0 refills | Status: AC

## 2024-04-06 NOTE — Progress Notes (Signed)
 "  [name] [name] Nov 04, 1965 [id number]   History:  59 y.o. [id number] presents for annual exam. Has noticed some discharge. No itching or odor. S/P 2018 TAH BSO for 6 cm dermoid cyst and fibroids, no HRT. Normal pap history. HTN managed by PCP, hypoythyroidism managed by endocrinology.   Gynecologic History Patient's last menstrual period was 01/27/2015.   Contraception/Family planning: status post hysterectomy Sexually active: Yes  Health Maintenance Last Pap: 08/20/2020. Results were: Normal Last mammogram: 12/15/2023. Results were: Normal Last colonoscopy: 01/21/2017. Results were: 3-year recall d/t difficult visualization. Negative Cologuard 2023 Last Dexa: Not indicated     04/06/2024   12:04 PM  Depression screen PHQ 2/9  Decreased Interest 0  Down, Depressed, Hopeless 0  PHQ - 2 Score 0     Past medical history, past surgical history, family history and social history were all reviewed and documented in the EPIC chart. 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  Son in [place], has 58, 29 and 69 year old children. 94 yo daughter.   ROS:  A ROS was performed and pertinent positives and negatives are included.  Exam:  Vitals:   04/06/24 1159  BP: 118/74  Pulse: 68  SpO2: 99%  Weight: 167 lb (75.8 kg)  Height: 5' 3.25 (1.607 m)      Body mass index is 29.35 kg/m.  General appearance:  Normal Thyroid :  Symmetrical, normal in size, without palpable masses or nodularity. Respiratory  Auscultation:  Clear without wheezing or rhonchi Cardiovascular  Auscultation:  Regular rate, without rubs, murmurs or gallops  Edema/varicosities:  Not grossly evident Abdominal  Soft,nontender, without masses, guarding or rebound.  Liver/spleen:  No organomegaly noted  Hernia:  None appreciated  Skin  Inspection:  Grossly normal Breasts: Examined lying and sitting.   Right: Without masses, retractions, nipple discharge or axillary adenopathy.   Left: Without  masses, retractions, nipple discharge or axillary adenopathy. Pelvic: External genitalia:  no lesions              Urethra:  normal appearing urethra with no masses, tenderness or lesions              Bartholins and Skenes: normal                 Vagina: + discharge, erythema              Cervix: absent Bimanual Exam:  Uterus:  absent              Adnexa: no mass, fullness, tenderness              Rectovaginal: Deferred              Anus:  normal, no lesions  [name] [name], CMA present as chaperone.   Wet prep + clue cells (+odor)   Assessment/Plan:  59 y.o. [id number] for annual exam.   Well female exam with routine gynecological exam - Education provided on SBEs, importance of preventative screenings, current guidelines, high calcium diet, regular exercise, and multivitamin daily. Labs done elsewhere.   Depression screening - PHQ - 0  Postmenopausal - no HRT. S/P 2018 TAH BSO for 6 cm dermoid cyst and fibroids  Vaginal discharge - Plan: WET PREP FOR TRICH, YEAST, CLUE  Bacterial vaginosis - Plan: metroNIDAZOLE  (METROGEL ) 0.75 % vaginal gel nightly x 5 nights. Feels she keeps getting this. Recommend women's probiotic, avoid harsh soaps. Can also do boric acid vaginally 1-2 times per week for prevention.   Screening  for cervical cancer - Pap 03/2016 ASCUS negative HPV, otherwise no significant abnormal pap history. Pap 2022 normal. No longer screening per guidelines.   Screening for breast cancer - Normal mammogram history. Continue annual screenings.  Normal breast exam today.  Screening for colon cancer - 2018 colonoscopy. 3-year repeat recommended due to difficult visualization but insurance would not cover. Negative Cologuard 2023. Recommend colonoscopy and she plans to schedule with [facility].   Screening for osteoporosis - Average risk. Will plan DXA at age 89.   Return in about 1 year (around 04/06/2025) for Annual.     [name] DELENA Shutter DNP, 12:27 PM 04/06/2024 "

## 2024-04-09 ENCOUNTER — Ambulatory Visit: Payer: Self-pay | Admitting: Nurse Practitioner

## 2025-04-09 ENCOUNTER — Ambulatory Visit: Admitting: Nurse Practitioner
